# Patient Record
Sex: Male | Born: 1970
Health system: Southern US, Community
[De-identification: ages and names within clinical notes are randomized; demographics above are authoritative.]

## PROBLEM LIST (undated history)

## (undated) DIAGNOSIS — M1990 Unspecified osteoarthritis, unspecified site: Secondary | ICD-10-CM

## (undated) DIAGNOSIS — R7989 Other specified abnormal findings of blood chemistry: Secondary | ICD-10-CM

## (undated) HISTORY — DX: Other specified abnormal findings of blood chemistry: R79.89

---
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
## 2006-04-20 ENCOUNTER — Emergency Department: Payer: Self-pay | Admitting: Emergency Medicine

## 2007-12-01 ENCOUNTER — Emergency Department: Payer: Self-pay | Admitting: Emergency Medicine

## 2009-03-11 ENCOUNTER — Ambulatory Visit: Payer: Self-pay | Admitting: General Practice

## 2011-10-01 ENCOUNTER — Emergency Department: Payer: Self-pay | Admitting: General Practice

## 2014-08-29 ENCOUNTER — Telehealth: Payer: Self-pay | Admitting: Urology

## 2014-08-29 DIAGNOSIS — N5082 Scrotal pain: Secondary | ICD-10-CM

## 2014-08-29 NOTE — Telephone Encounter (Signed)
Patient stated that Dr. Erlene Quan told him that if we was not feeling better to give the office a call.  Patient called this morning and stated that he is really not feeling any better, still having pain.  He is taking his last medication today.

## 2014-08-29 NOTE — Telephone Encounter (Signed)
Spoke with patient, patient had improved somewhat but acutely worsened after doing a "dead lift" type activity for work with pain located just behind his right testicle..  His pain has been more severe again this week.    I have recommended that we pursue scrotal ultrasound with follow-up in our office thereafter to make sure there were not missing any underlying masses or hernias.  Patient is agreeable with this plan.  In the meantime, he was advised to restart ibuprofen for pain relief and to present to the emergency room if develops severe uncontrolled pain.  Hollice Espy, MD

## 2014-09-01 ENCOUNTER — Other Ambulatory Visit: Payer: Self-pay | Admitting: Family Medicine

## 2014-09-01 DIAGNOSIS — N5082 Scrotal pain: Secondary | ICD-10-CM

## 2014-09-02 NOTE — Telephone Encounter (Signed)
Patient is scheduled for an ultrasound on 09/03/14 at 3:15pm and a follow up appointment with you on 09/05/14.

## 2014-09-03 ENCOUNTER — Ambulatory Visit
Admission: RE | Admit: 2014-09-03 | Discharge: 2014-09-03 | Disposition: A | Discharge status: Home / Self Care | Payer: BLUE CROSS/BLUE SHIELD | Source: Ambulatory Visit | Attending: Urology | Admitting: Urology

## 2014-09-03 ENCOUNTER — Telehealth: Payer: Self-pay | Admitting: Urology

## 2014-09-03 DIAGNOSIS — N508 Other specified disorders of male genital organs: Secondary | ICD-10-CM | POA: Diagnosis present

## 2014-09-03 DIAGNOSIS — N5082 Scrotal pain: Secondary | ICD-10-CM

## 2014-09-03 NOTE — Telephone Encounter (Signed)
Reviewed scrotal ultrasound with patient.  No pathology identified.  He does state that his pain seems to be improving and only worsens which is working out/ being physically active.  I have advised him to take it easy and back of exercise for a while until his pain fully resolves then slowly add back activity as tolerated.    No need to f/u Friday as scheduled as he is doing better.    He will call to arrange f/u as needed.   Hollice Espy, MD

## 2014-09-05 ENCOUNTER — Ambulatory Visit: Payer: Self-pay | Admitting: Urology

## 2014-12-19 ENCOUNTER — Other Ambulatory Visit: Payer: Self-pay | Admitting: Physician Assistant

## 2014-12-19 DIAGNOSIS — M25561 Pain in right knee: Secondary | ICD-10-CM

## 2014-12-30 ENCOUNTER — Ambulatory Visit
Admission: RE | Admit: 2014-12-30 | Discharge: 2014-12-30 | Disposition: A | Discharge status: Home / Self Care | Payer: Worker's Compensation | Source: Ambulatory Visit | Attending: Physician Assistant | Admitting: Physician Assistant

## 2014-12-30 DIAGNOSIS — M25561 Pain in right knee: Secondary | ICD-10-CM | POA: Diagnosis present

## 2014-12-30 DIAGNOSIS — X58XXXD Exposure to other specified factors, subsequent encounter: Secondary | ICD-10-CM | POA: Diagnosis not present

## 2014-12-30 DIAGNOSIS — S83511D Sprain of anterior cruciate ligament of right knee, subsequent encounter: Secondary | ICD-10-CM | POA: Diagnosis not present

## 2014-12-30 DIAGNOSIS — D1800 Hemangioma unspecified site: Secondary | ICD-10-CM | POA: Diagnosis not present

## 2014-12-30 DIAGNOSIS — M9420 Chondromalacia, unspecified site: Secondary | ICD-10-CM | POA: Insufficient documentation

## 2014-12-30 DIAGNOSIS — M179 Osteoarthritis of knee, unspecified: Secondary | ICD-10-CM | POA: Diagnosis not present

## 2015-01-13 ENCOUNTER — Ambulatory Visit
Admission: RE | Admit: 2015-01-13 | Discharge: 2015-01-13 | Disposition: A | Discharge status: Home / Self Care | Payer: BLUE CROSS/BLUE SHIELD | Source: Ambulatory Visit | Attending: Orthopedic Surgery | Admitting: Orthopedic Surgery

## 2015-01-13 ENCOUNTER — Encounter
Admission: RE | Admit: 2015-01-13 | Discharge: 2015-01-13 | Disposition: A | Discharge status: Home / Self Care | Payer: Worker's Compensation | Source: Ambulatory Visit | Attending: Orthopedic Surgery | Admitting: Orthopedic Surgery

## 2015-01-13 DIAGNOSIS — S83511A Sprain of anterior cruciate ligament of right knee, initial encounter: Secondary | ICD-10-CM | POA: Diagnosis not present

## 2015-01-13 DIAGNOSIS — R002 Palpitations: Secondary | ICD-10-CM | POA: Insufficient documentation

## 2015-01-13 DIAGNOSIS — Y929 Unspecified place or not applicable: Secondary | ICD-10-CM | POA: Diagnosis not present

## 2015-01-13 DIAGNOSIS — Y939 Activity, unspecified: Secondary | ICD-10-CM | POA: Diagnosis not present

## 2015-01-13 DIAGNOSIS — X58XXXA Exposure to other specified factors, initial encounter: Secondary | ICD-10-CM | POA: Diagnosis not present

## 2015-01-13 HISTORY — DX: Unspecified osteoarthritis, unspecified site: M19.90

## 2015-01-13 LAB — BASIC METABOLIC PANEL
ANION GAP: 7 (ref 5–15)
BUN: 21 mg/dL — AB (ref 6–20)
CALCIUM: 9.5 mg/dL (ref 8.9–10.3)
CO2: 29 mmol/L (ref 22–32)
Chloride: 105 mmol/L (ref 101–111)
Creatinine, Ser: 1.08 mg/dL (ref 0.61–1.24)
GFR calc Af Amer: >60 mL/min (ref >60)
Glucose, Bld: 97 mg/dL (ref 65–99)
POTASSIUM: 3.7 mmol/L (ref 3.5–5.1)
SODIUM: 141 mmol/L (ref 135–145)

## 2015-01-13 LAB — CBC
HCT: 40.9 % (ref 40.0–52.0)
Hemoglobin: 14.2 g/dL (ref 13.0–18.0)
MCH: 28.5 pg (ref 26.0–34.0)
MCHC: 34.7 g/dL (ref 32.0–36.0)
MCV: 82.2 fL (ref 80.0–100.0)
PLATELETS: 188 10*3/uL (ref 150–440)
RBC: 4.98 MIL/uL (ref 4.40–5.90)
RDW: 12.9 % (ref 11.5–14.5)
WBC: 7 10*3/uL (ref 3.8–10.6)

## 2015-01-13 LAB — URINALYSIS COMPLETE WITH MICROSCOPIC (ARMC ONLY)
Bacteria, UA: NONE SEEN
Bilirubin Urine: NEGATIVE
Glucose, UA: NEGATIVE mg/dL
Hgb urine dipstick: NEGATIVE
KETONES UR: NEGATIVE mg/dL
Leukocytes, UA: NEGATIVE
Nitrite: NEGATIVE
PH: 8 (ref 5.0–8.0)
PROTEIN: NEGATIVE mg/dL
Specific Gravity, Urine: 1.02 (ref 1.005–1.030)

## 2015-01-13 LAB — APTT: APTT: 33 s (ref 24–36)

## 2015-01-13 LAB — PROTIME-INR
INR: 1.06
PROTHROMBIN TIME: 14 s (ref 11.4–15.0)

## 2015-01-13 NOTE — Patient Instructions (Signed)
  Your procedure is scheduled on: January 15, 2015(Thursday) Report to Day Surgery.[facility]) To find out your arrival time please call [phone] between 1PM - 3PM on January 14, 2015(Wednesday).  Remember: Instructions that are not followed completely may result in serious medical risk, up to and including death, or upon the discretion of your surgeon and anesthesiologist your surgery may need to be rescheduled.    __x__ 1. Do not eat food or drink liquids after midnight. No gum chewing or hard candies.     ____ 2. No Alcohol for 24 hours before or after surgery.   ____ 3. Bring all medications with you on the day of surgery if instructed.    __x__ 4. Notify your doctor if there is any change in your medical condition     (cold, fever, infections).     Do not wear jewelry, make-up, hairpins, clips or nail polish.  Do not wear lotions, powders, or perfumes. You may wear deodorant.  Do not shave 48 hours prior to surgery. Men may shave face and neck.  Do not bring valuables to the hospital.    [facility] is not responsible for any belongings or valuables.               Contacts, dentures or bridgework may not be worn into surgery.  Leave your suitcase in the car. After surgery it may be brought to your room.  For patients admitted to the hospital, discharge time is determined by your                treatment team.   Patients discharged the day of surgery will not be allowed to drive home.   Please read over the following fact sheets that you were given:   Surgical Site Infection Prevention   ____ Take these medicines the morning of surgery with A SIP OF WATER:    1.       ____ Fleet Enema (as directed)   __x__ Use CHG Soap as directed  ____ Use inhalers on the day of surgery  ____ Stop metformin 2 days prior to surgery    ____ Take 1/2 of usual insulin dose the night before surgery and none on the morning of surgery.   ____ Stop Coumadin/Plavix/aspirin on    __x__ Stop Anti-inflammatories on (Tylenol ok to take for pain if needed)   ____ Stop supplements until after surgery.    ____ Bring C-Pap to the hospital.

## 2015-01-14 ENCOUNTER — Ambulatory Visit: Payer: BLUE CROSS/BLUE SHIELD | Admitting: Physical Therapy

## 2015-01-15 ENCOUNTER — Ambulatory Visit: Payer: Worker's Compensation | Admitting: Certified Registered Nurse Anesthetist

## 2015-01-15 ENCOUNTER — Ambulatory Visit
Admission: RE | Admit: 2015-01-15 | Discharge: 2015-01-15 | Disposition: A | Discharge status: Home / Self Care | Payer: Worker's Compensation | Source: Ambulatory Visit | Attending: Orthopedic Surgery | Admitting: Orthopedic Surgery

## 2015-01-15 ENCOUNTER — Encounter: Payer: Self-pay | Admitting: *Deleted

## 2015-01-15 ENCOUNTER — Encounter
Admission: RE | Disposition: A | Discharge status: Home / Self Care | Payer: Self-pay | Source: Ambulatory Visit | Attending: Orthopedic Surgery

## 2015-01-15 DIAGNOSIS — Y929 Unspecified place or not applicable: Secondary | ICD-10-CM | POA: Insufficient documentation

## 2015-01-15 DIAGNOSIS — S83511A Sprain of anterior cruciate ligament of right knee, initial encounter: Secondary | ICD-10-CM | POA: Insufficient documentation

## 2015-01-15 DIAGNOSIS — Y939 Activity, unspecified: Secondary | ICD-10-CM | POA: Insufficient documentation

## 2015-01-15 DIAGNOSIS — X58XXXA Exposure to other specified factors, initial encounter: Secondary | ICD-10-CM | POA: Insufficient documentation

## 2015-01-15 HISTORY — PX: KNEE ARTHROSCOPY WITH ANTERIOR CRUCIATE LIGAMENT (ACL) REPAIR WITH HAMSTRING GRAFT: SHX5645

## 2015-01-15 SURGERY — KNEE ARTHROSCOPY WITH ANTERIOR CRUCIATE LIGAMENT (ACL) REPAIR WITH HAMSTRING GRAFT
Anesthesia: General | Site: Knee | Laterality: Right | Wound class: Clean

## 2015-01-15 MED ORDER — EPINEPHRINE HCL 1 MG/ML IJ SOLN
INTRAMUSCULAR | Status: DC | PRN
  Administered 2015-01-15: 1 mg via INTRAMUSCULAR

## 2015-01-15 MED ORDER — ACETAMINOPHEN 325 MG PO TABS: 650.0000 mg | ORAL_TABLET | Freq: Four times a day (QID) | ORAL | Status: DC | PRN

## 2015-01-15 MED ORDER — FENTANYL CITRATE (PF) 100 MCG/2ML IJ SOLN
INTRAMUSCULAR | Status: DC
Start: 2015-01-15 — End: 2015-01-15
  Filled 2015-01-15: qty 2

## 2015-01-15 MED ORDER — HYDROMORPHONE HCL 1 MG/ML IJ SOLN
0.2500 mg | INTRAMUSCULAR | Status: DC | PRN
  Administered 2015-01-15: 0.5 mg via INTRAVENOUS
  Administered 2015-01-15 (×2): 0.25 mg via INTRAVENOUS
  Administered 2015-01-15 (×2): 0.5 mg via INTRAVENOUS

## 2015-01-15 MED ORDER — PROMETHAZINE HCL 25 MG/ML IJ SOLN
6.2500 mg | INTRAMUSCULAR | Status: DC | PRN
  Administered 2015-01-15: 6.25 mg via INTRAVENOUS

## 2015-01-15 MED ORDER — BUPIVACAINE HCL 0.25 % IJ SOLN
INTRAMUSCULAR | Status: DC | PRN
  Administered 2015-01-15: 30 mL

## 2015-01-15 MED ORDER — OXYCODONE HCL 5 MG PO TABS
ORAL_TABLET | ORAL | Status: AC
  Administered 2015-01-15: 5 mg via ORAL
  Filled 2015-01-15: qty 1

## 2015-01-15 MED ORDER — MORPHINE SULFATE (PF) 2 MG/ML IV SOLN: 2.0000 mg | INTRAVENOUS | Status: DC | PRN

## 2015-01-15 MED ORDER — NEOMYCIN-POLYMYXIN B GU 40-200000 IR SOLN
Status: AC
  Filled 2015-01-15: qty 4

## 2015-01-15 MED ORDER — NEOMYCIN-POLYMYXIN B GU 40-200000 IR SOLN
Status: DC | PRN
  Administered 2015-01-15: 4 mL

## 2015-01-15 MED ORDER — ONDANSETRON HCL 4 MG/2ML IJ SOLN: 4.0000 mg | Freq: Four times a day (QID) | INTRAMUSCULAR | Status: DC | PRN

## 2015-01-15 MED ORDER — FENTANYL CITRATE (PF) 100 MCG/2ML IJ SOLN
25.0000 ug | INTRAMUSCULAR | Status: DC | PRN
  Administered 2015-01-15: 25 ug via INTRAVENOUS
  Administered 2015-01-15: 50 ug via INTRAVENOUS
  Administered 2015-01-15: 25 ug via INTRAVENOUS

## 2015-01-15 MED ORDER — LIDOCAINE HCL (CARDIAC) 20 MG/ML IV SOLN
INTRAVENOUS | Status: DC | PRN
  Administered 2015-01-15: 20 mg via INTRAVENOUS

## 2015-01-15 MED ORDER — ACETAMINOPHEN 10 MG/ML IV SOLN
INTRAVENOUS | Status: DC | PRN
  Administered 2015-01-15: 1000 mg via INTRAVENOUS

## 2015-01-15 MED ORDER — FAMOTIDINE 20 MG PO TABS
ORAL_TABLET | ORAL | Status: AC
  Filled 2015-01-15: qty 1

## 2015-01-15 MED ORDER — ASPIRIN EC 325 MG PO TBEC: 325.0000 mg | DELAYED_RELEASE_TABLET | Freq: Two times a day (BID) | ORAL | Status: DC

## 2015-01-15 MED ORDER — LACTATED RINGERS IV SOLN
INTRAVENOUS | Status: DC
  Administered 2015-01-15 (×3): via INTRAVENOUS

## 2015-01-15 MED ORDER — FENTANYL CITRATE (PF) 100 MCG/2ML IJ SOLN
INTRAMUSCULAR | Status: DC | PRN
  Administered 2015-01-15: 100 ug via INTRAVENOUS
  Administered 2015-01-15: 25 ug via INTRAVENOUS

## 2015-01-15 MED ORDER — OXYCODONE HCL 5 MG PO TABS: 5.0000 mg | ORAL_TABLET | ORAL | Status: DC | PRN

## 2015-01-15 MED ORDER — SODIUM CHLORIDE 0.9 % IV SOLN: INTRAVENOUS | Status: DC

## 2015-01-15 MED ORDER — HYDROMORPHONE HCL 1 MG/ML IJ SOLN
INTRAMUSCULAR | Status: AC
  Filled 2015-01-15: qty 1

## 2015-01-15 MED ORDER — LIDOCAINE HCL (PF) 1 % IJ SOLN
INTRAMUSCULAR | Status: AC
  Filled 2015-01-15: qty 30

## 2015-01-15 MED ORDER — HYDROMORPHONE HCL 1 MG/ML IJ SOLN
INTRAMUSCULAR | Status: DC | PRN
  Administered 2015-01-15 (×2): 0.5 mg via INTRAVENOUS

## 2015-01-15 MED ORDER — ACETAMINOPHEN 10 MG/ML IV SOLN
INTRAVENOUS | Status: AC
Start: 2015-01-15 — End: 2015-01-15
  Filled 2015-01-15: qty 100

## 2015-01-15 MED ORDER — BUPIVACAINE HCL (PF) 0.25 % IJ SOLN
INTRAMUSCULAR | Status: AC
  Filled 2015-01-15: qty 30

## 2015-01-15 MED ORDER — MIDAZOLAM HCL 5 MG/5ML IJ SOLN
INTRAMUSCULAR | Status: DC | PRN
  Administered 2015-01-15: 2 mg via INTRAVENOUS

## 2015-01-15 MED ORDER — ONDANSETRON HCL 4 MG PO TABS: 4.0000 mg | ORAL_TABLET | Freq: Four times a day (QID) | ORAL | Status: DC | PRN

## 2015-01-15 MED ORDER — DOCUSATE SODIUM 100 MG PO CAPS
100.0000 mg | ORAL_CAPSULE | Freq: Two times a day (BID) | ORAL | Status: DC
  Filled 2015-01-15 (×3): qty 1

## 2015-01-15 MED ORDER — CEFAZOLIN SODIUM-DEXTROSE 2-3 GM-% IV SOLR
INTRAVENOUS | Status: AC
  Filled 2015-01-15: qty 50

## 2015-01-15 MED ORDER — ONDANSETRON HCL 4 MG/2ML IJ SOLN
INTRAMUSCULAR | Status: DC | PRN
  Administered 2015-01-15: 4 mg via INTRAVENOUS

## 2015-01-15 MED ORDER — OXYCODONE HCL 5 MG PO TABS
5.0000 mg | ORAL_TABLET | ORAL | Status: DC | PRN
  Administered 2015-01-15: 5 mg via ORAL

## 2015-01-15 MED ORDER — SODIUM CHLORIDE 0.9 % IJ SOLN
INTRAMUSCULAR | Status: AC
  Filled 2015-01-15: qty 10

## 2015-01-15 MED ORDER — DIPHENHYDRAMINE HCL 12.5 MG/5ML PO ELIX
12.5000 mg | ORAL_SOLUTION | ORAL | Status: DC | PRN
  Filled 2015-01-15: qty 10

## 2015-01-15 MED ORDER — PROMETHAZINE HCL 25 MG/ML IJ SOLN
INTRAMUSCULAR | Status: AC
  Filled 2015-01-15: qty 1

## 2015-01-15 MED ORDER — CEFAZOLIN SODIUM-DEXTROSE 2-3 GM-% IV SOLR
2.0000 g | Freq: Once | INTRAVENOUS | Status: AC
  Administered 2015-01-15: 2 g via INTRAVENOUS

## 2015-01-15 MED ORDER — PROMETHAZINE HCL 12.5 MG PO TABS: 12.5000 mg | ORAL_TABLET | ORAL | Status: DC | PRN

## 2015-01-15 MED ORDER — EPINEPHRINE HCL 1 MG/ML IJ SOLN
INTRAMUSCULAR | Status: AC
  Filled 2015-01-15: qty 1

## 2015-01-15 MED ORDER — PROPOFOL 10 MG/ML IV BOLUS
INTRAVENOUS | Status: DC | PRN
  Administered 2015-01-15: 40 mg via INTRAVENOUS
  Administered 2015-01-15: 160 mg via INTRAVENOUS
  Administered 2015-01-15: 30 mg via INTRAVENOUS
  Administered 2015-01-15: 50 mg via INTRAVENOUS

## 2015-01-15 MED ORDER — LIDOCAINE HCL 1 % IJ SOLN
INTRAMUSCULAR | Status: DC | PRN
  Administered 2015-01-15: 8 mL

## 2015-01-15 MED ORDER — FAMOTIDINE 20 MG PO TABS
20.0000 mg | ORAL_TABLET | Freq: Once | ORAL | Status: AC
  Administered 2015-01-15: 20 mg via ORAL

## 2015-01-15 MED ORDER — ACETAMINOPHEN 650 MG RE SUPP
650.0000 mg | Freq: Four times a day (QID) | RECTAL | Status: DC | PRN
  Filled 2015-01-15: qty 1

## 2015-01-15 SURGICAL SUPPLY — 85 items
ADAPTER IRRIG TUBE 2 SPIKE SOL (ADAPTER) ×6 IMPLANT
ANCHOR BUTTON TIGHTROPE ACL RT (Orthopedic Implant) ×3 IMPLANT
ANCHOR SUPER #2 ORTHOCORD (MISCELLANEOUS) ×3 IMPLANT
BASIN GRAD PLASTIC 32OZ STRL (MISCELLANEOUS) ×3 IMPLANT
BIT DRILL PIN RETRO (DRILL) ×1 IMPLANT
BLADE SURG 15 STRL LF DISP TIS (BLADE) ×2 IMPLANT
BLADE SURG 15 STRL SS (BLADE) ×4
BLADE SURG SZ11 CARB STEEL (BLADE) ×3 IMPLANT
BNDG COHESIVE 4X5 TAN STRL (GAUZE/BANDAGES/DRESSINGS) ×3 IMPLANT
BNDG COHESIVE 6X5 TAN STRL LF (GAUZE/BANDAGES/DRESSINGS) ×3 IMPLANT
BNDG ESMARK 6X12 TAN STRL LF (GAUZE/BANDAGES/DRESSINGS) ×3 IMPLANT
BUR RADIUS 3.5 (BURR) IMPLANT
BUR RADIUS 4.0X18.5 (BURR) ×3 IMPLANT
BUR RADIUS 5.5 (BURR) ×3 IMPLANT
CLEANER CAUTERY TIP 5X5 PAD (MISCELLANEOUS) IMPLANT
CLOSURE WOUND 1/2 X4 (GAUZE/BANDAGES/DRESSINGS) ×2
COOLER POLAR GLACIER W/PUMP (MISCELLANEOUS) ×3 IMPLANT
CUTTER DUAL RETRO (MISCELLANEOUS) ×3 IMPLANT
CUTTER DUAL RETRO 9MM (CUTTER) ×3 IMPLANT
DRAPE FLUOR MINI C-ARM 54X84 (DRAPES) ×3 IMPLANT
DRAPE IMP U-DRAPE 54X76 (DRAPES) ×6 IMPLANT
DRAPE INCISE IOBAN 66X45 STRL (DRAPES) ×3 IMPLANT
DRAPE SHEET LG 3/4 BI-LAMINATE (DRAPES) ×3 IMPLANT
DRAPE TABLE BACK 80X90 (DRAPES) ×3 IMPLANT
DRAPE U-SHAPE 47X51 STRL (DRAPES) ×3 IMPLANT
DRILL FLIPCUTTER II 8.0MM (INSTRUMENTS) ×2 IMPLANT
DRILL PIN RETRO (DRILL) ×3
DURAPREP 26ML APPLICATOR (WOUND CARE) ×6 IMPLANT
FLIPCUTTER II 8.0MM (INSTRUMENTS) ×6
GAUZE PETRO XEROFOAM 1X8 (MISCELLANEOUS) ×3 IMPLANT
GAUZE SPONGE 4X4 12PLY STRL (GAUZE/BANDAGES/DRESSINGS) ×3 IMPLANT
GLOVE BIOGEL PI IND STRL 9 (GLOVE) ×1 IMPLANT
GLOVE BIOGEL PI INDICATOR 9 (GLOVE) ×2
GLOVE SURG 9.0 ORTHO LTXF (GLOVE) ×6 IMPLANT
GOWN STRL REUS TWL 2XL XL LVL4 (GOWN DISPOSABLE) ×3 IMPLANT
GOWN STRL REUS W/ TWL LRG LVL3 (GOWN DISPOSABLE) ×1 IMPLANT
GOWN STRL REUS W/TWL LRG LVL3 (GOWN DISPOSABLE) ×2
GUIDEWIRE 1.1MM (WIRE) ×3 IMPLANT
HANDLE YANKAUER SUCT BULB TIP (MISCELLANEOUS) ×3 IMPLANT
IMPL SHOULDER ARTHROSCOPY L (Shoulder) ×2 IMPLANT
IV LACTATED RINGER IRRG 3000ML (IV SOLUTION) ×16
IV LR IRRIG 3000ML ARTHROMATIC (IV SOLUTION) ×8 IMPLANT
KIT RM TURNOVER STRD PROC AR (KITS) ×3 IMPLANT
LABEL OR SOLS (LABEL) IMPLANT
MAT BLUE FLOOR 46X72 FLO (MISCELLANEOUS) ×6 IMPLANT
NDL SAFETY ECLIPSE 18X1.5 (NEEDLE) ×1 IMPLANT
NEEDLE FILTER BLUNT 18X 1/2SAF (NEEDLE) ×2
NEEDLE FILTER BLUNT 18X1 1/2 (NEEDLE) ×1 IMPLANT
NEEDLE HYPO 18GX1.5 SHARP (NEEDLE) ×2
NEPTUNE MANIFOLD (MISCELLANEOUS) ×3 IMPLANT
PACK ARTHROSCOPY KNEE (MISCELLANEOUS) ×3 IMPLANT
PAD ABD DERMACEA PRESS 5X9 (GAUZE/BANDAGES/DRESSINGS) ×6 IMPLANT
PAD CLEANER CAUTERY TIP 5X5 (MISCELLANEOUS)
PAD GROUND ADULT SPLIT (MISCELLANEOUS) ×3 IMPLANT
PAD WRAPON POLAR KNEE (MISCELLANEOUS) ×1 IMPLANT
PENCIL ELECTRO HAND CTR (MISCELLANEOUS) ×3 IMPLANT
SCREW BIOCOMP 9X28 (Screw) ×6 IMPLANT
SET TUBE SUCT SHAVER OUTFL 24K (TUBING) ×3 IMPLANT
SET TUBE TIP INTRA-ARTICULAR (MISCELLANEOUS) ×3 IMPLANT
SHOULDER ARTHROSCOPY ARTHREX L (Shoulder) ×6 IMPLANT
STAPLE SPIKE LIGAMENT 11X20MM (Staple) ×3 IMPLANT
STRIP CLOSURE SKIN 1/2X4 (GAUZE/BANDAGES/DRESSINGS) ×4 IMPLANT
SUCTION FRAZIER TIP 10 FR DISP (SUCTIONS) IMPLANT
SUT 2 FIBERLOOP 20 STRT BLUE (SUTURE) ×12
SUT ETHILON 4-0 (SUTURE) ×2
SUT ETHILON 4-0 FS2 18XMFL BLK (SUTURE) ×1
SUT FIBERSNARE 2 CLSD LOOP (SUTURE) ×3 IMPLANT
SUT FIBERWIRE #2 38 T-5 BLUE (SUTURE) ×3
SUT MNCRL AB 4-0 PS2 18 (SUTURE) ×3 IMPLANT
SUT ORTHOCORD 2X36 W/O NDL (SUTURE) ×3 IMPLANT
SUT VIC AB 0 CT1 36 (SUTURE) ×3 IMPLANT
SUT VIC AB 2-0 CT2 27 (SUTURE) ×3 IMPLANT
SUT VIC AB 2-0 SH 27 (SUTURE)
SUT VIC AB 2-0 SH 27XBRD (SUTURE) IMPLANT
SUTURE 2 FIBERLOOP 20 STRT BLU (SUTURE) ×4 IMPLANT
SUTURE ETHLN 4-0 FS2 18XMF BLK (SUTURE) ×1 IMPLANT
SUTURE FIBERWR #2 38 T-5 BLUE (SUTURE) ×1 IMPLANT
SYR BULB IRRIG 60ML STRL (SYRINGE) ×3 IMPLANT
SYRINGE 10CC LL (SYRINGE) ×6 IMPLANT
TAPE UMBIL 1/8X18 RADIOPA (MISCELLANEOUS) ×3 IMPLANT
TUBING ARTHRO INFLOW-ONLY STRL (TUBING) ×3 IMPLANT
TUBING CONNECTING 10 (TUBING) ×2 IMPLANT
TUBING CONNECTING 10' (TUBING) ×1
WAND HAND CNTRL MULTIVAC 90 (MISCELLANEOUS) ×3 IMPLANT
WRAPON POLAR PAD KNEE (MISCELLANEOUS) ×3

## 2015-01-15 NOTE — Anesthesia Postprocedure Evaluation (Signed)
  Anesthesia Post-op Note  Patient: Brandon Welch  Procedure(s) Performed: Procedure(s): KNEE ARTHROSCOPY WITH ANTERIOR CRUCIATE LIGAMENT (ACL) REPAIR WITH HAMSTRING AUTOGRAFT (Right)  Anesthesia type:General  Patient location: PACU  Post pain: Pain level controlled  Post assessment: Post-op Vital signs reviewed, Patient's Cardiovascular Status Stable, Respiratory Function Stable, Patent Airway and No signs of Nausea or vomiting  Post vital signs: Reviewed and stable  Last Vitals:  Filed Vitals:   01/15/15 0550  BP: 129/82  Pulse: 85  Temp: 36.3 C  Resp: 16    Level of consciousness: awake, alert  and patient cooperative  Complications: No apparent anesthesia complications

## 2015-01-15 NOTE — Progress Notes (Signed)
Leg brace brought in by Dr Mack Guise, and sent on bed with patient to OR

## 2015-01-15 NOTE — H&P (Signed)
The patient has been re-examined, and the chart reviewed, and there have been no interval changes to the documented history and physical.    The risks, benefits, and alternatives have been discussed at length, and the patient is willing to proceed.   

## 2015-01-15 NOTE — Anesthesia Preprocedure Evaluation (Addendum)
Anesthesia Evaluation  Patient identified by MRN, date of birth, ID band Patient awake    Reviewed: Allergy & Precautions, H&P , NPO status , Patient's Chart, lab work & pertinent test results, reviewed documented beta blocker date and time   History of Anesthesia Complications Negative for: history of anesthetic complications  Airway Mallampati: III  TM Distance: >3 FB Neck ROM: full    Dental no notable dental hx. (+) Teeth Intact   Pulmonary neg pulmonary ROS,    Pulmonary exam normal breath sounds clear to auscultation       Cardiovascular Exercise Tolerance: Good negative cardio ROS Normal cardiovascular exam Rhythm:regular Rate:Normal     Neuro/Psych negative neurological ROS  negative psych ROS   GI/Hepatic negative GI ROS, Neg liver ROS,   Endo/Other  negative endocrine ROS  Renal/GU negative Renal ROS  negative genitourinary   Musculoskeletal   Abdominal   Peds  Hematology negative hematology ROS (+)   Anesthesia Other Findings Past Medical History:   Arthritis                                                    Reproductive/Obstetrics negative OB ROS                             Anesthesia Physical Anesthesia Plan  ASA: I  Anesthesia Plan: General   Post-op Pain Management: GA combined w/ Regional for post-op pain   Induction:   Airway Management Planned:   Additional Equipment:   Intra-op Plan:   Post-operative Plan:   Informed Consent: I have reviewed the patients History and Physical, chart, labs and discussed the procedure including the risks, benefits and alternatives for the proposed anesthesia with the patient or authorized representative who has indicated his/her understanding and acceptance.   Dental Advisory Given  Plan Discussed with: Anesthesiologist, CRNA and Surgeon  Anesthesia Plan Comments:        Anesthesia Quick Evaluation

## 2015-01-15 NOTE — Progress Notes (Signed)
Ancef 2 gm sent to OR with patient

## 2015-01-15 NOTE — Op Note (Signed)
01/15/2015  11:20 AM  PATIENT:  Brandon Welch    PRE-OPERATIVE DIAGNOSIS:  RUPTURE OF ANTERIOR CRUCIATE LIGAMENT RIGHT KNEE  POST-OPERATIVE DIAGNOSIS:  Same  PROCEDURE:  KNEE ARTHROSCOPY WITH ANTERIOR CRUCIATE LIGAMENT (ACL) RECONSTRUCTION WITH HAMSTRING AUTOGRAFT  SURGEON:  Thornton Park, MD  ANESTHESIA:   General  PREOPERATIVE INDICATIONS:  Brandon Welch is a  44 y.o. 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 Has been diagnosed with a RUPTURE OF ANTERIOR CRUCIATE LIGAMENT RIGHT KNEE.  Given the patient's high demand on his right knee is elected to proceed with surgical reconstruction. The anterior cruciate ligament tears been confirmed both on physical exam and MRI. No associated chondral injuries or meniscal tear seen on his MRI.   The risks benefits and alternatives were discussed with the patient preoperatively including but not limited to the risks of infection, bleeding, nerve or blood vessel injury, knee stiffness/arthrofibrosis, hardware failure, re-tear of the anterior cruciate ligament graft, persistent pain or instability, osteoarthritis and the need for revision surgery.  Medical risks include but are not limited to DVT and pulmonary embolism, stroke, pneumonia, respiratory failure and death. Patient understood these risks and wished to proceed with surgical reconstruction.   OPERATIVE IMPLANTS: Arthrex anterior cruciate ligament tightrope RC, Artherex biocomposite 9 x 28 mm tibial interference screw and 11 mm spiked staple.  OPERATIVE FINDINGS: The anterior cruciate ligament was completely torn. The PCL was intact. There was no tearing of the medial or lateral menisci, there were no chondral injuries seen.   OPERATIVE PROCEDURE: The patient was brought to the operating room and placed in the supine position. General anesthesia was administered. 2 g of Ancef were given. The lower extremity was prepped and draped in usual sterile fashion. Exam under  anesthesia demonstrated the was performed which demonstrated anterior laxity on Lachman's and anterior drawer testing. Patient had positive pivot shift. There is a negative posterior drawer test. Patient had no instability to varus valgus stress testing at 0 and 30 of flexion. His knee range of motion was from 0 120. He did not have a significant effusion.. Time out was performed to verify the patient's name, date of birth, medical record number, correct site of surgery correct procedure to be performed. Was also used to verify the patient received antibiotics and all appropriate instruments, implants and radiographs studies were available in the room. Once all in attendance were in agreement case began. A tourniquet was applied to the right upper thigh but was not inflated.  Proposed arthroscopy incisions were drawn out with a surgical marker and pre-injected with 1% lidocaine plain. An 11 blade was used to establish an inferior medial and lateral portals. The medial portal was created under direct visualization using an 18-gauge spinal needle for localization. A full diagnostic examination of the knee was performed including the suprapatellar pouch, the patella femoral joint, medial lateral gutters, the medial and lateral compartments, the intercondylar notch in the posterior knee. Findings on arthroscopy included only an isolated anterior cruciate ligament tear. There is no meniscal tears or chondral injuries seen. Patient had the anterior cruciate ligament fibers are debrided with a 5.5 mm resector shaver blade. This was also used perform a notchplasty. Once the intercondylar notch and then prepped the attention was turned to harvesting the hamstring autografts.  A longitudinal incision was made over the anteromedial proximal tibia. The sartorius fascia was incised with a 15 blade and reflected to reveal the underlying gracilis and semitendinosus. These were harvested using  a tendon stripper. They're  prepared on the back table. The graft was measured to be 8 mm on the femoral side and 9 m on the tibial side. The length of the graft was 120 mm. The graft was placed on the Graftmaster table under 15 mmHg of tension and kept moist on the back table until implantation.   The attention was then turned to tunnel creation. The femoral tunnel cutting guide was then placed through the lateral portal. The arthroscope was placed in the medial portal at this point. The intercondylar distance was measured at 40 mm at. A flip cutter drill guide was advanced into the intercondylar notch. The blade was engaged and the femoral tunnel was created in a retrograde fashion to 35 mm. A fiber stick suture was placed through the femoral tunnel brought out the lateral portal and clamped for later graft passage. The attention was then turned to tibial tunnel creation. This was done with a fixed angle tibial retro-drill guide. A drill pin was inserted through the anterior tibia and advanced until it engaged the 9 mm drill bit. A tibial tunnel was then created in a retrograde fashion. The fiber stick was brought out through the tibial tunnel. The 4 stranded hamstring tibial autograft was then shuttled through the knee using the fiber stick graft. Once the button was flipped on the lateral femoral cortex FluoroScan image was taken to confirm it was laying flat against the lateral cortex of the femur. Once this was confirmed the hamstring graft was advanced into position using the white suture ends of the Arthrex tight rope RC button. The graft was bottomed out into the femoral tunnel. The knee was then cycled 25 times to remove creep. The knee was then flexed approximately 30. An Arthrex bio composite interference screw 9 x 20 mm was then advanced into position with countertraction on the tibial side of the graft and a posterior drawer force directed to the tibia. Once the interference screw was in position and 11 mm spiked staple was  placed over the distal end of the graft on the tibial side as backup fixation.  The patient had a firm endpoint without anterior laxity on Lachman's test. His range of motion remains 0-120. Final arthroscopic images of the graft were taken. He should have no graft impingement in full extension. The wounds were copiously irrigated. The deep fascia of the anterior tibial incision was closed with interrupted 0 Vicryl.  The of the tibial incision subcutaneous tissue was closed with a 2-0 Vicryl and the skin was approximated with a running 4-0 Monocryl. The arthroscopy portal incisions were closed with 4-0 nylon along with the small stab incision over the lateral femur used for placement of the femoral tunnel.  Patient had a dry sterile dressing applied along with Steri-Strips and Xeroform. The incisions and the joint were injected with 4% Marcaine plain.  Patient had a Polar Care sleeve, TENS unit leads and a hinged knee brace locked in extension placed. He is brought to the PACU in stable condition. I scrubbed and present the entire case and all sharp and instrument counts were correct at conclusion the case. I spoke with the patient's wife in the postop consultation room to let her know that patient was stable in recovery room the case was performed without complication.     Timoteo Gaul, MD

## 2015-01-15 NOTE — Discharge Instructions (Signed)

## 2015-01-15 NOTE — Anesthesia Procedure Notes (Signed)
Procedure Name: LMA Insertion Date/Time: 01/15/2015 7:45 AM Performed by: Naomie Dean Pre-anesthesia Checklist: Patient identified, Emergency Drugs available, Suction available, Patient being monitored and Timeout performed Patient Re-evaluated:Patient Re-evaluated prior to inductionOxygen Delivery Method: Circle system utilized Preoxygenation: Pre-oxygenation with 100% oxygen Intubation Type: IV induction Ventilation: Mask ventilation without difficulty LMA: LMA inserted LMA Size: 3.5

## 2015-01-15 NOTE — Transfer of Care (Signed)
Immediate Anesthesia Transfer of Care Note  Patient: Brandon Welch  Procedure(s) Performed: Procedure(s): KNEE ARTHROSCOPY WITH ANTERIOR CRUCIATE LIGAMENT (ACL) REPAIR WITH HAMSTRING AUTOGRAFT (Right)  Patient Location: PACU  Anesthesia Type:General  Level of Consciousness: awake  Airway & Oxygen Therapy: Patient connected to face mask oxygen  Post-op Assessment: Report given to RN  Post vital signs: stable  Last Vitals:  Filed Vitals:   01/15/15 0550  BP: 129/82  Pulse: 85  Temp: 36.3 C  Resp: 16    Complications: No apparent anesthesia complications

## 2015-05-28 ENCOUNTER — Other Ambulatory Visit: Payer: Self-pay | Admitting: Orthopedic Surgery

## 2015-05-28 DIAGNOSIS — M25561 Pain in right knee: Secondary | ICD-10-CM

## 2015-06-02 ENCOUNTER — Ambulatory Visit (HOSPITAL_COMMUNITY)
Admission: RE | Admit: 2015-06-02 | Discharge: 2015-06-02 | Disposition: A | Discharge status: Home / Self Care | Payer: Worker's Compensation | Source: Ambulatory Visit | Attending: Orthopedic Surgery | Admitting: Orthopedic Surgery

## 2015-06-02 DIAGNOSIS — M25461 Effusion, right knee: Secondary | ICD-10-CM | POA: Insufficient documentation

## 2015-06-02 DIAGNOSIS — X58XXXA Exposure to other specified factors, initial encounter: Secondary | ICD-10-CM | POA: Diagnosis not present

## 2015-06-02 DIAGNOSIS — M84361A Stress fracture, right tibia, initial encounter for fracture: Secondary | ICD-10-CM | POA: Diagnosis not present

## 2015-06-02 DIAGNOSIS — R938 Abnormal findings on diagnostic imaging of other specified body structures: Secondary | ICD-10-CM | POA: Diagnosis not present

## 2015-06-02 DIAGNOSIS — M25561 Pain in right knee: Secondary | ICD-10-CM | POA: Diagnosis not present

## 2015-06-15 ENCOUNTER — Ambulatory Visit: Payer: BLUE CROSS/BLUE SHIELD

## 2016-08-23 IMAGING — CR DG ORBITS FOR FOREIGN BODY
1 series · 2 of 2 positions shown · non-contrast
Comparison: None.

CLINICAL DATA: Metal working/exposure; clearance prior to MRI

EXAM:
ORBITS FOR FOREIGN BODY - 2 VIEW

[Series 1: w orbit pa · 0.14mm/px · 2 of 2 slices shown]
[im 1/2]
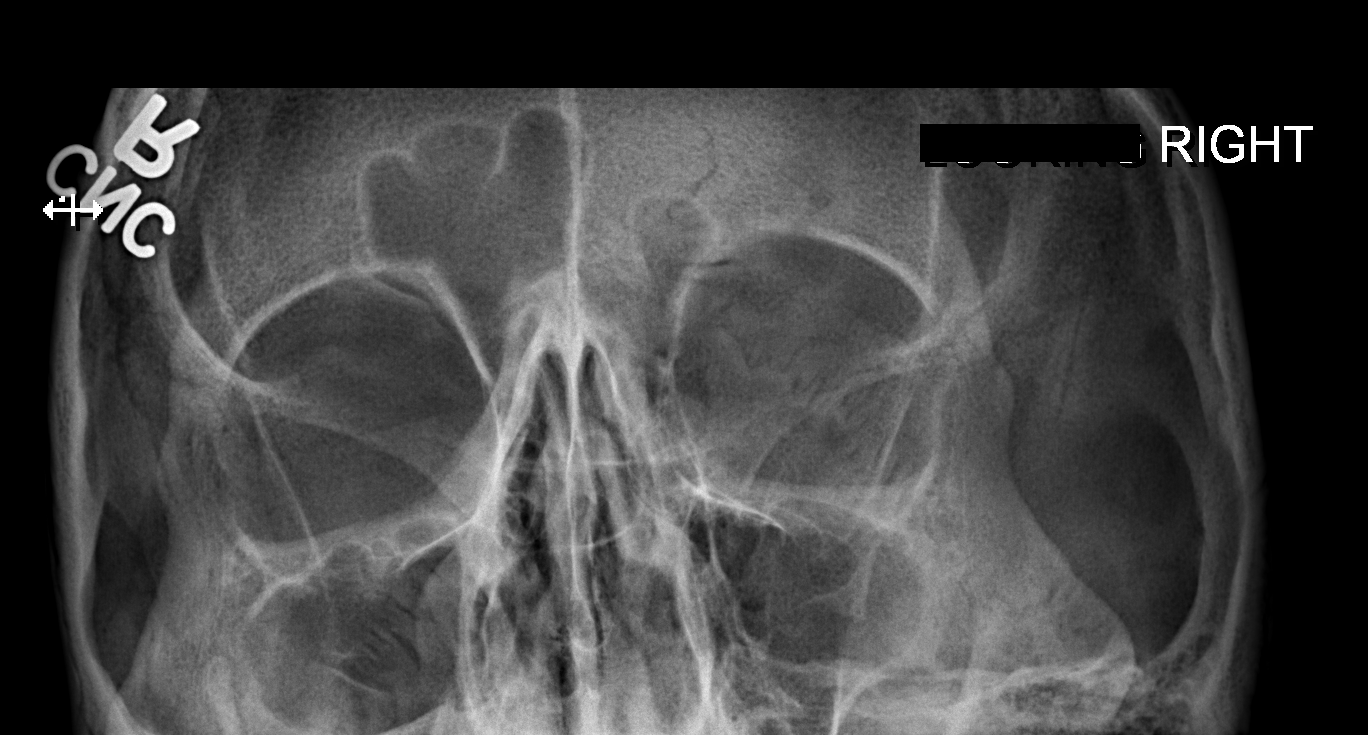
[im 2/2]
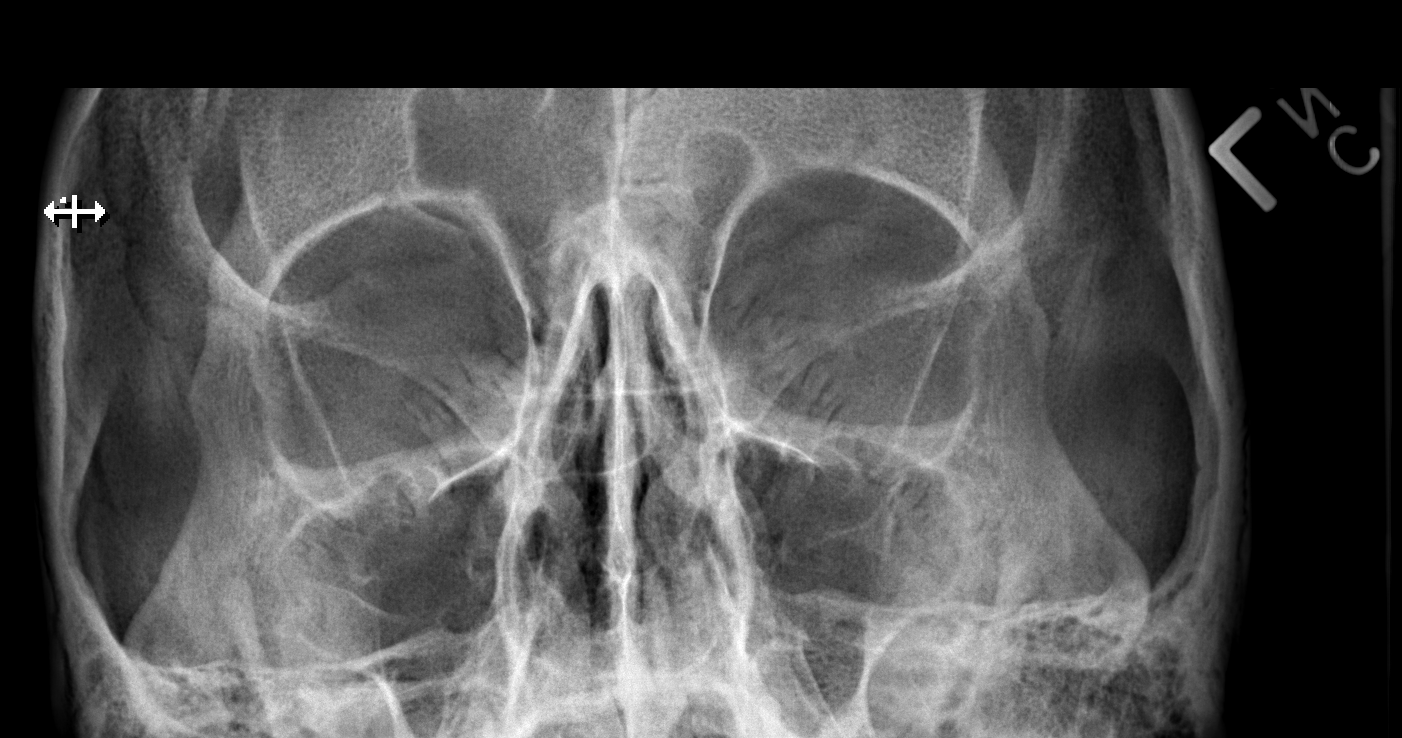

[2 of 2 positions shown; findings below may reference images not displayed]

FINDINGS: There is no evidence of metallic foreign body within the orbits. No
significant bone abnormality identified.
IMPRESSION: No evidence of metallic foreign body within the orbits.

## 2016-08-23 IMAGING — MR MR KNEE*R* W/O CM
6 series · 39 of 40 positions shown · non-contrast
Comparison: None.

CLINICAL DATA: Twisting injury at work 6 weeks ago. Persistent pain
and swelling.

EXAM:
MRI OF THE RIGHT KNEE WITHOUT CONTRAST
TECHNIQUE: Multiplanar, multisequence MR imaging of the knee was performed. No
intravenous contrast was administered.

[Series 3: PD fat-sat · axial · 3.0mm · 0.29mm/px · z∈[-53,+53]mm · 7 of 33 slices shown (1 of 3)]
[im 1/33]
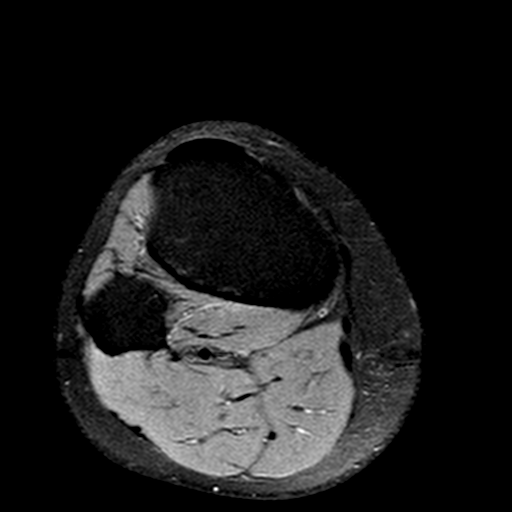
[im 6/33]
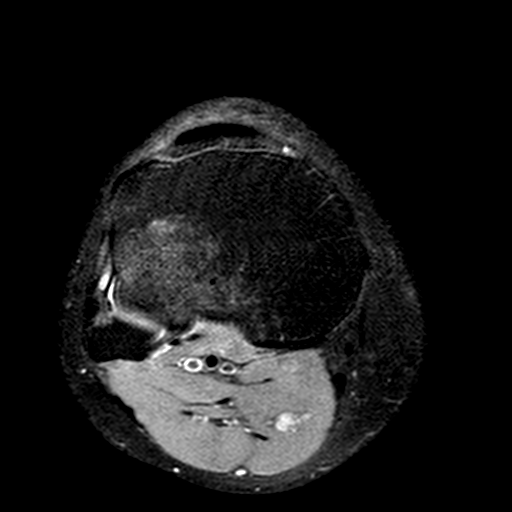
[im 11/33]
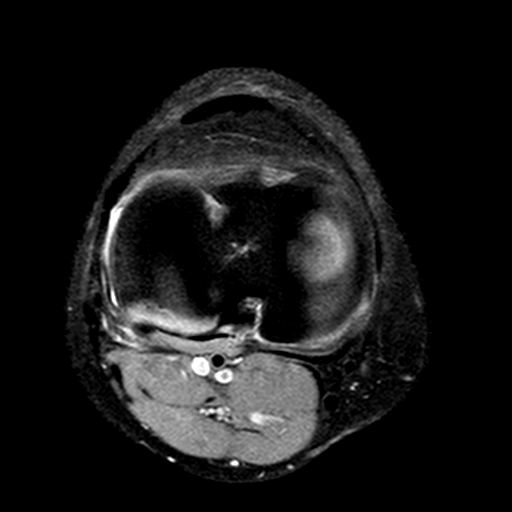
[im 17/33]
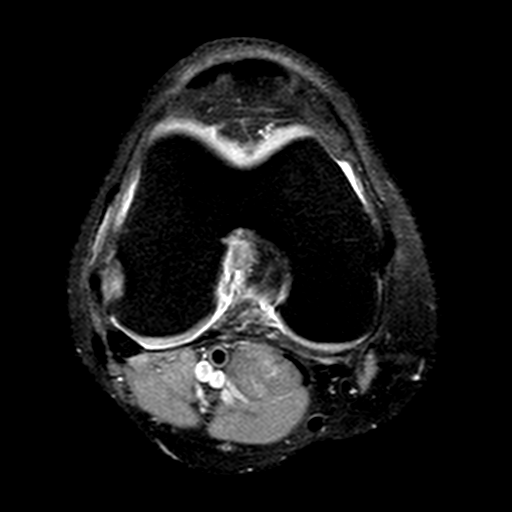
[im 22/33]
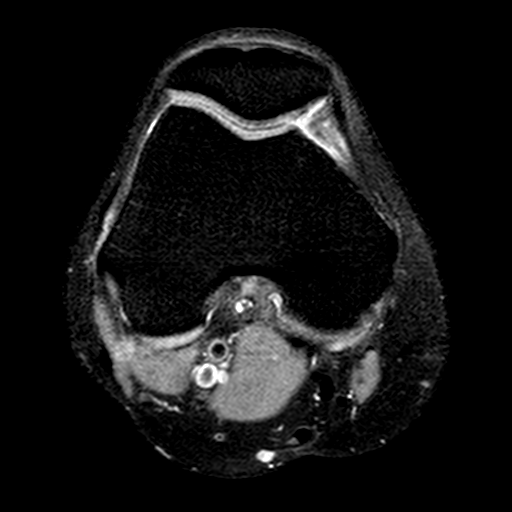
[im 27/33]
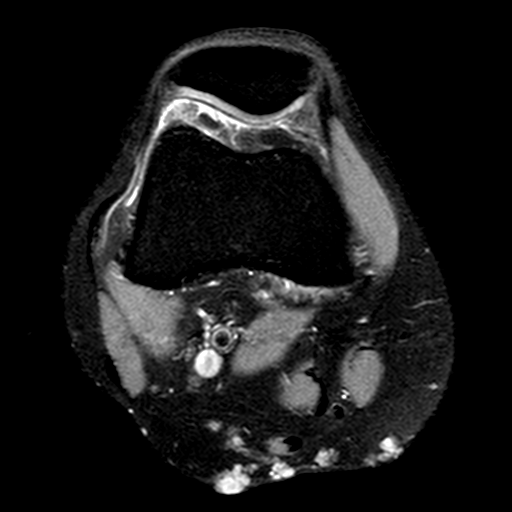
[im 33/33]
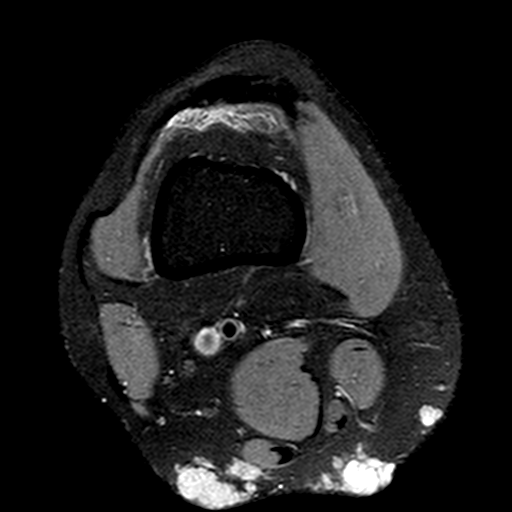

[Series 4: T2 fat-sat · coronal · 3.0mm · 0.62mm/px · 7 of 33 slices shown]
[im 1/33]
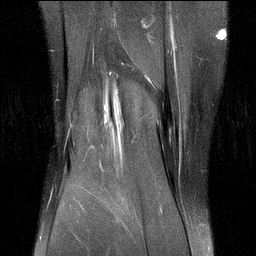
[im 6/33]
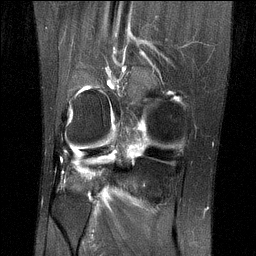
[im 11/33]
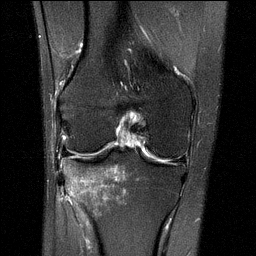
[im 17/33]
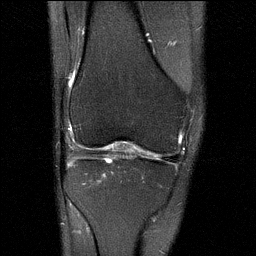
[im 22/33]
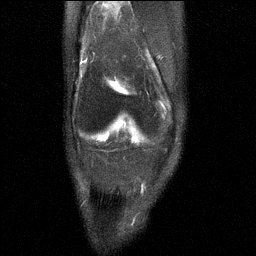
[im 27/33]
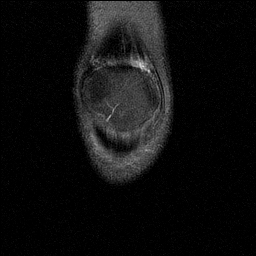
[im 33/33]
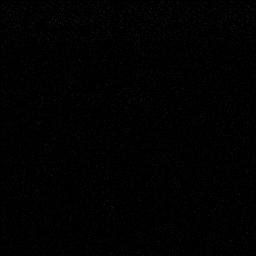

[Series 5: PD fat-sat · sagittal · 3.0mm · 0.62mm/px · 7 of 32 slices shown (2 of 3)]
[im 1/32]
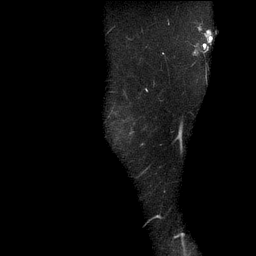
[im 6/32]
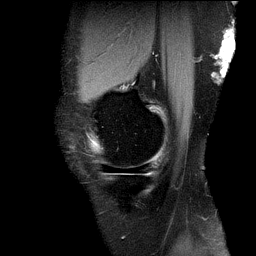
[im 11/32]
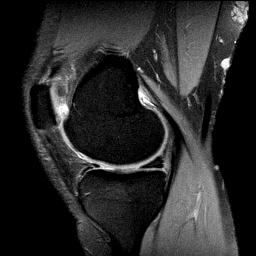
[im 16/32]
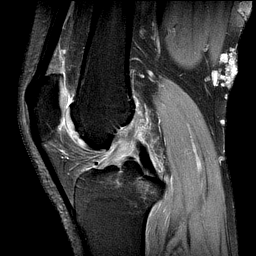
[im 21/32]
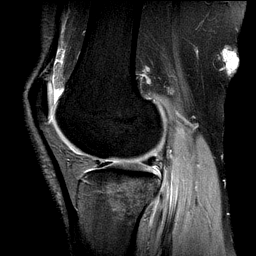
[im 26/32]
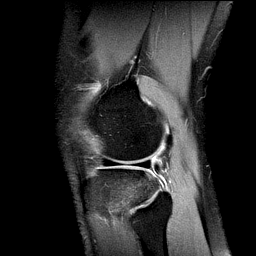
[im 32/32]
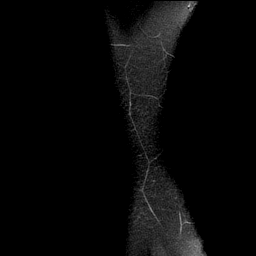

[Series 6: PD fat-sat · coronal · 3.0mm · 0.62mm/px · 8 of 33 slices shown (3 of 3)]
[im 1/33]
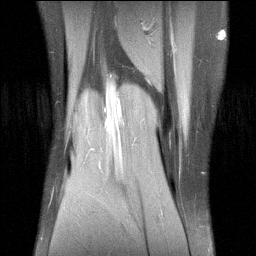
[im 5/33]
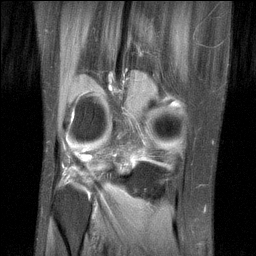
[im 10/33]
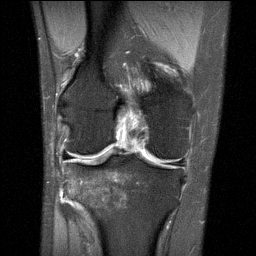
[im 14/33]
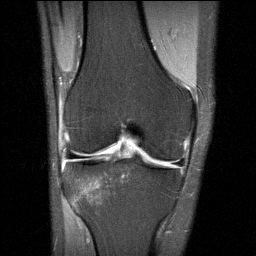
[im 19/33]
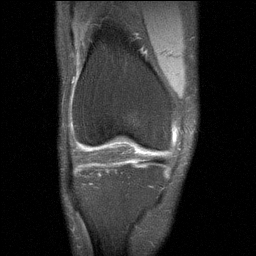
[im 23/33]
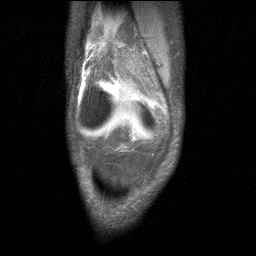
[im 28/33]
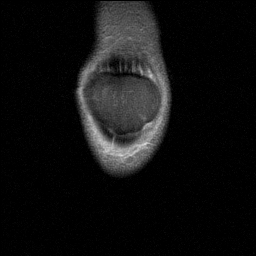
[im 33/33]
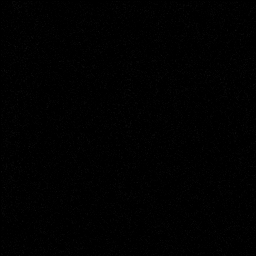

[Series 7: T1 · coronal · 3.0mm · 0.62mm/px · 7 of 33 slices shown]
[im 1/33]
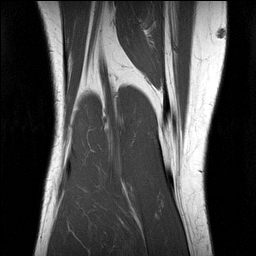
[im 5/33]
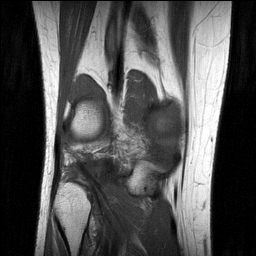
[im 10/33]
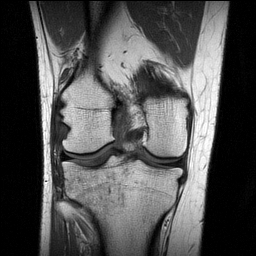
[im 14/33]
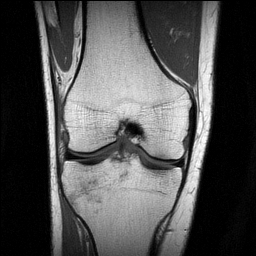
[im 19/33]
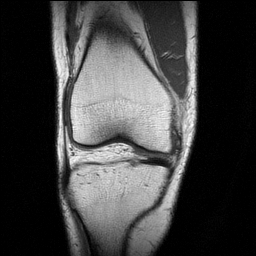
[im 23/33]
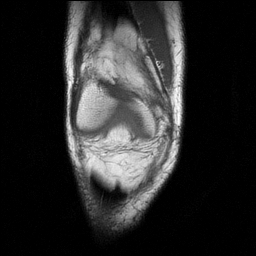
[im 28/33]
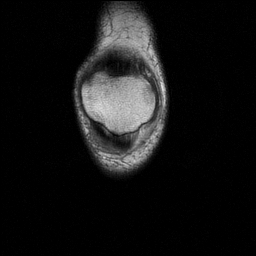

[Series 8: PD · coronal · 3.0mm · 0.31mm/px · 3 of 12 slices shown]
[im 1/12]
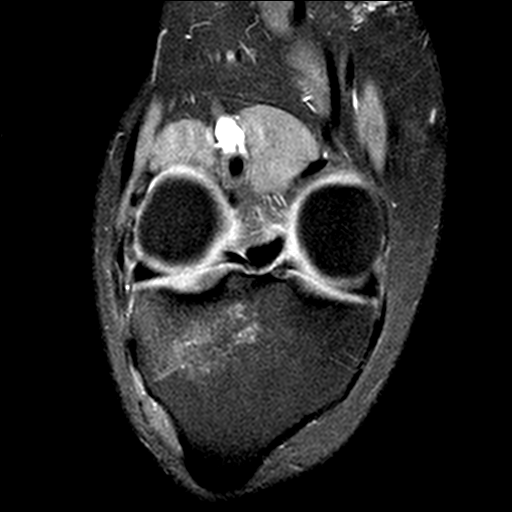
[im 6/12]
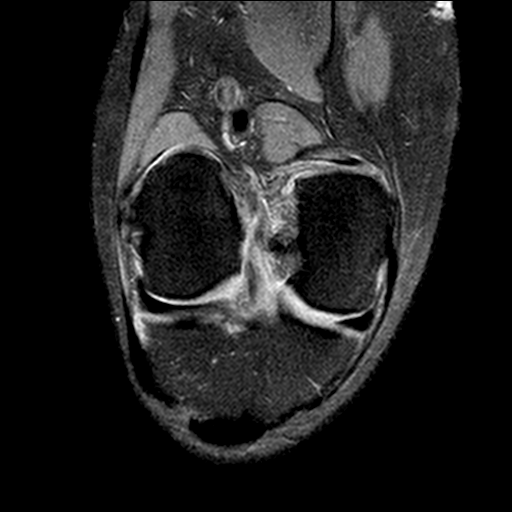
[im 12/12]
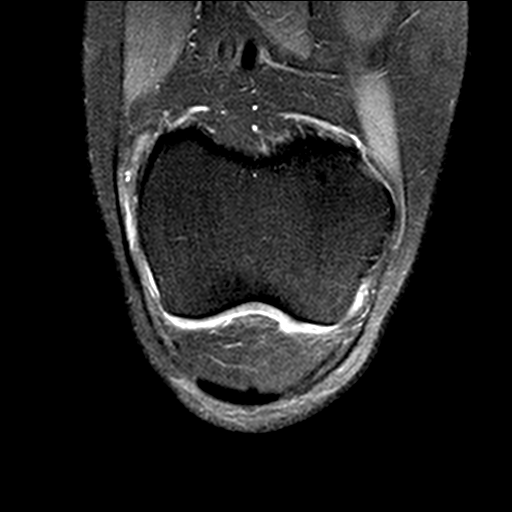

[39 of 40 positions shown; findings below may reference images not displayed]

FINDINGS: MENISCI

Medial meniscus:  Intact.

Lateral meniscus:  Intact.

LIGAMENTS

Cruciates: Midsubstance ACL tear. There may be a few wispy fibers
intact but the ligament is likely dysfunctional/nonfunctional. The
PCL is intact.

Collaterals:  Intact.

CARTILAGE

Patellofemoral:  Mild degenerative chondrosis/ chondromalacia.

Medial:  Mild degenerative chondrosis.

Lateral:  Mild degenerative chondrosis.

Joint:  Small joint effusion and mild synovitis.

Popliteal Fossa:  No popliteal mass or Baker's cyst.

Extensor Mechanism: The patella retinacular structures are intact
and the quadriceps and patellar tendons are intact.

Bones: Posterior tibial bone contusion laterally with small
subchondral impaction type fracture and a small defect in the
subchondral plate.

Other: There is a subcutaneous vascular appearing soft tissue lesion
involving the posterior aspect of the knee. This is most likely a
hemangioma.
IMPRESSION: 1. Midsubstance ACL tear. The PCL and collateral ligaments are
intact
2. Significant bone contusion involving the posterior aspect of the
lateral tibial plateau with a small impaction type fracture.
3. Mild tricompartmental degenerative chondrosis/chondromalacia.
4. Small joint effusion and mild synovitis.
5. Cutaneous and subcutaneous lesion involving the posterior aspect
of the knee, most likely a benign hemangioma. Recommend correlation
with clinical findings.

## 2016-09-06 IMAGING — CR DG CHEST 2V
1 series · 2 of 2 positions shown · non-contrast
Comparison: None.

CLINICAL DATA: Preop for knee surgery.

EXAM:
CHEST  2 VIEW

[Series 1: dg chest 2 view · 0.14mm/px · 2 of 2 slices shown]
[im 1/2]
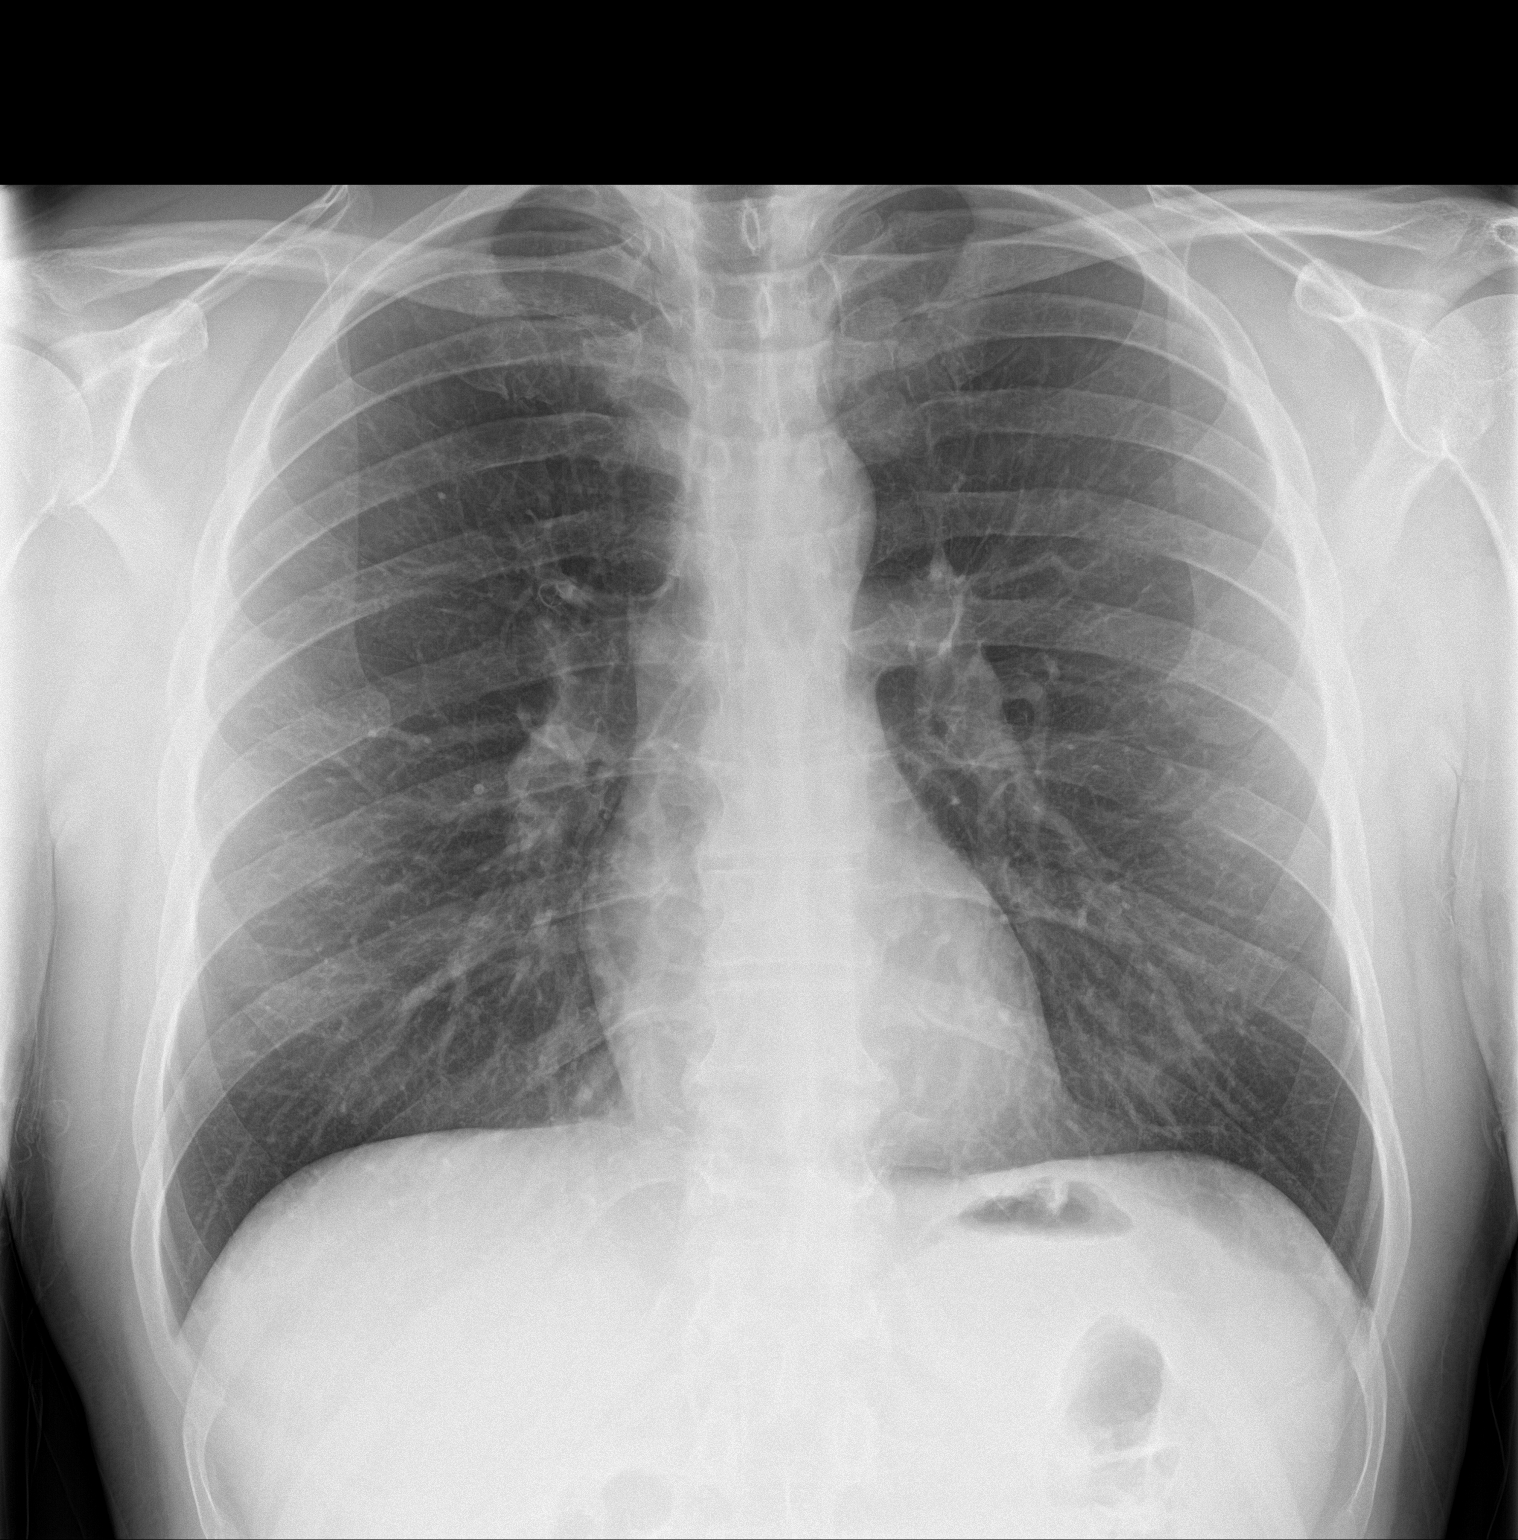
[im 2/2]
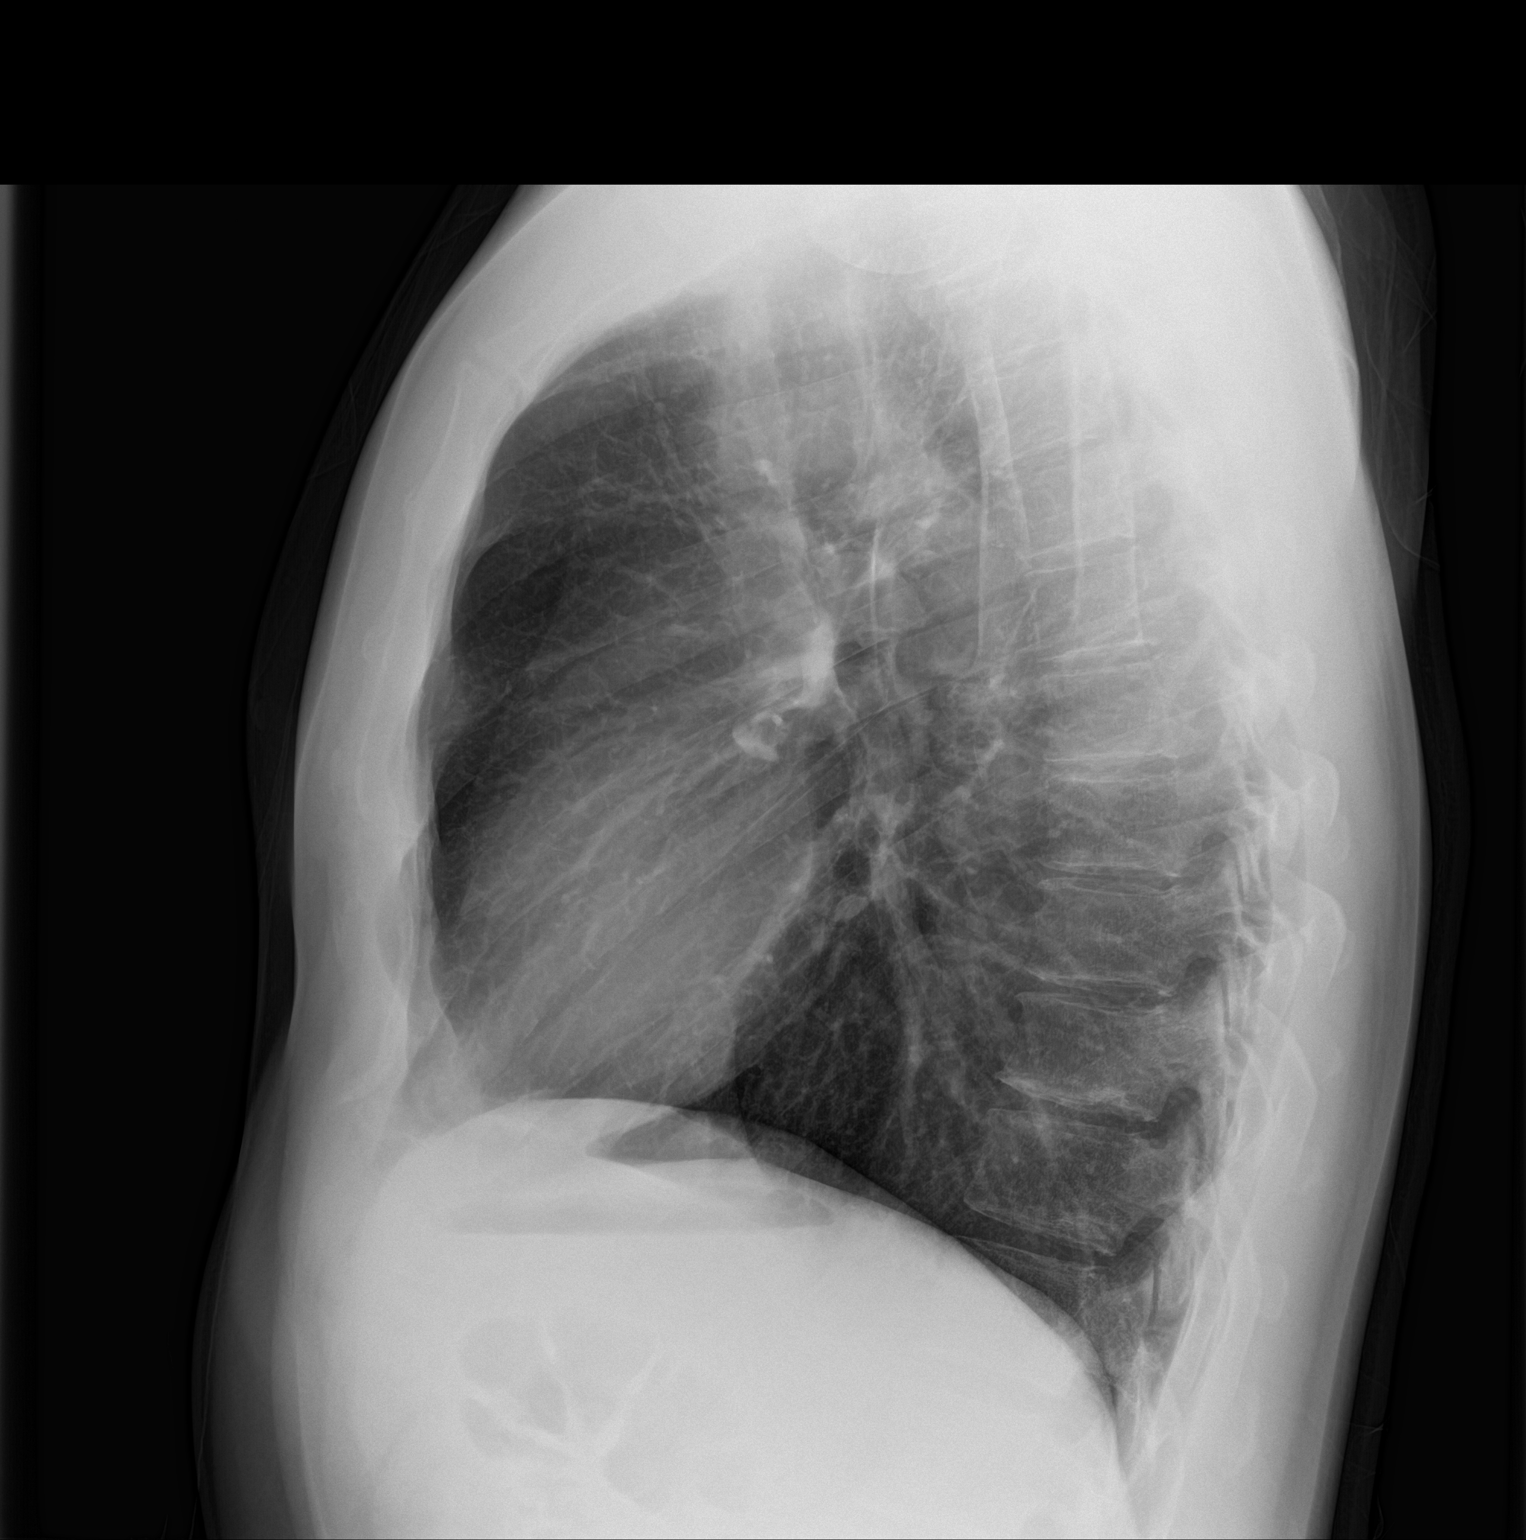

[2 of 2 positions shown; findings below may reference images not displayed]

FINDINGS: The heart size and mediastinal contours are within normal limits.
Both lungs are clear. The visualized skeletal structures are
unremarkable.
IMPRESSION: No active cardiopulmonary disease.

## 2018-05-20 ENCOUNTER — Other Ambulatory Visit: Payer: Self-pay | Admitting: Family Medicine

## 2018-05-21 NOTE — Telephone Encounter (Signed)
Pharmacy requesting refills. Thanks!  

## 2018-11-28 ENCOUNTER — Telehealth: Payer: Self-pay | Admitting: Internal Medicine

## 2018-11-28 NOTE — Telephone Encounter (Signed)
He had a sinus headache two days ago. He went to sleep and the next morning the pressure and headache went away. He now is having trouble hearing out of his right ear.

## 2018-11-28 NOTE — Telephone Encounter (Signed)
Can make an appt to evaluate this afternoon or tomorrow. Wear N95 when patient here.

## 2018-11-28 NOTE — Telephone Encounter (Signed)
Appointment scheduled for 11/29/2018 at 8:45am.  AMD

## 2018-11-28 NOTE — Telephone Encounter (Signed)
Spoke with [employer].  States he's taking Mucinex Sinus 2x/day - says you can take up to 4x/day.  Said Right ear can't hear good - kind of muffled - started yesterday.  Still going on today.  Says it's about the same today as it was yesterday.  Denies pain and drainage from the ear.  No issues with the left ear.  Tried opening mouth wide & swallowing to open the right ear up.  Tried pinching nose & blowing to open the right ear.  Neither of these helped.  AMD

## 2018-11-29 ENCOUNTER — Encounter: Payer: Self-pay | Admitting: Internal Medicine

## 2018-11-29 ENCOUNTER — Other Ambulatory Visit: Payer: Self-pay

## 2018-11-29 ENCOUNTER — Ambulatory Visit: Payer: Self-pay | Admitting: Internal Medicine

## 2018-11-29 VITALS — BP 126/76 | HR 81 | Temp 97.6°F | Resp 16 | Ht 74.0 in | Wt 214.0 lb

## 2018-11-29 DIAGNOSIS — H6981 Other specified disorders of Eustachian tube, right ear: Secondary | ICD-10-CM | POA: Insufficient documentation

## 2018-11-29 DIAGNOSIS — H6121 Impacted cerumen, right ear: Secondary | ICD-10-CM | POA: Insufficient documentation

## 2018-11-29 DIAGNOSIS — Z9889 Other specified postprocedural states: Secondary | ICD-10-CM | POA: Insufficient documentation

## 2018-11-29 DIAGNOSIS — J069 Acute upper respiratory infection, unspecified: Secondary | ICD-10-CM | POA: Insufficient documentation

## 2018-11-29 NOTE — Progress Notes (Signed)
S - Patient presents with right ear blockage feeling after had sinus congestion and pain/HA's more on that right side and some achiness and infectious concerns, he couldn't hear much at all out of the right ear vs the left, and that has improved some in last 24 hours but still a noticeable difference. No ear pain.   Denies increase in sinus congestion, no sore throat, no cough no fevers, no sx's of concern for Covid presently otherwise Hearing decreased from the right ear  He noted he may be getting some more allergy sx's in the past year or two with change of seasons, and has never taken any meds for this.   Also feels a slight protruson under his scar on his right knee . He had ACL surgery prior with a screw and a staple used to anchor he thinks, and wonders if this the issue. Not painful presently. No knee pain sx's.  Allergies  Allergen Reactions  . Aloe Rash    Current Outpatient Medications on File Prior to Visit  Medication Sig Dispense Refill  . Testosterone 20.25 MG/ACT (1.62%) GEL APP 4 PUMPS QAM UTD     No current facility-administered medications on file prior to visit.      O - NAD, masked  BP 126/76 (BP Location: Left Arm, Patient Position: Sitting, Cuff Size: Large)   Pulse 81   Temp 97.6 F (36.4 C) (Oral)   Resp 16   Ht 6\' 2"  (1.88 m)   Wt 214 lb (97.1 kg) Comment: with uniform on  SpO2 99%   BMI 27.48 kg/m    HEENT - conj - non-inj'ed, no focal sinus tenderness Left TM and canal clear,  Right TM not visualized with cerumen in canal  NT tugging on Right lobe, NT tugging on Left lobe Neck - no subauricular adenopathy, no increased anterior cervical nodes or posterior cervical nodes, no rigidity Ext -  A small very slightly raised area was palpated under the scar inferior to the right about an inch inferior to the start of the vertical scar, Not painful to palpate and no erythema, no skin disruption Affect not flat, approp with conversation  Ass/Plan 1:  cerumen impaction - right ear  Discussed the procedure of irrigation with warm water with device have in office and patient agreed to proceed and this was done without incident, he tolerated the procedure well with a little water getting on his pants and I apologized (he noted no worries, will dry) A large amount of cerumen was successfully irrigated Exam after noted min erythema in the canal (likely from procedure), TM clear.   2. URI - likely viral and better  3. Eustachian tube dysfunction - believe an element of this contributing to his hearing decrease as well as the cerumen impaction  Educated, likely secondary to recent URI and that is better Sometimes recommend a flonase nasal to help, also ibuprofen prn over tylenol type products as has anti-inflammatory effects which tylenol does not Will improve after URI likely and is usually self-limiting   4. S/p ACL repair - can have a screw or staple start to work its way out and cause a raised area that is more sx'ic. He has no sx's of concern presently, and noted if does become more bothersome or cause sx's/signs of concern as reviewed, to f/u and may need to image and refer to ortho again as sometimes they remove the screw or staple at that point.   Will f/u if hearing not improved  over time

## 2018-12-28 ENCOUNTER — Ambulatory Visit: Payer: Self-pay

## 2018-12-28 DIAGNOSIS — Z23 Encounter for immunization: Secondary | ICD-10-CM

## 2019-01-30 DIAGNOSIS — H52223 Regular astigmatism, bilateral: Secondary | ICD-10-CM | POA: Diagnosis not present

## 2019-02-26 ENCOUNTER — Other Ambulatory Visit: Payer: Self-pay

## 2019-02-26 DIAGNOSIS — E291 Testicular hypofunction: Secondary | ICD-10-CM

## 2019-02-27 MED ORDER — TESTOSTERONE 20.25 MG/ACT (1.62%) TD GEL: TRANSDERMAL | 0 refills | Status: DC

## 2019-02-27 NOTE — Telephone Encounter (Signed)
Message sent to Admin Asst Roseanne Reno) to schedule labs with [facility] & physical for 03/2019.  AMD

## 2019-02-27 NOTE — Telephone Encounter (Signed)
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  Last physical with PA Tamala Julian Jan 2020  Last clinic appt Dr Roxan Hockey 11/29/2018.  Bridge refill given until patient can be scheduled labs and follow up appt.  Electronic Rx sent to his pharmacy of choice testosterone 1.62% gel apply 4 pumps qam #150 RF0  PMP website currently down reviewed [place] prior to authorizing Rx.  Will review PMP website when available ticket into help desk and confirmed last fill date with patient pharmacy staff last filled 28 Dec 2018

## 2019-04-01 ENCOUNTER — Ambulatory Visit: Payer: Self-pay

## 2019-04-01 ENCOUNTER — Other Ambulatory Visit: Payer: Self-pay

## 2019-04-01 DIAGNOSIS — Z0000 Encounter for general adult medical examination without abnormal findings: Secondary | ICD-10-CM

## 2019-04-01 DIAGNOSIS — E291 Testicular hypofunction: Secondary | ICD-10-CM

## 2019-04-01 LAB — POCT URINALYSIS DIPSTICK
Bilirubin, UA: NEGATIVE
Blood, UA: NEGATIVE
Glucose, UA: NEGATIVE
Ketones, UA: NEGATIVE
Leukocytes, UA: NEGATIVE
Nitrite, UA: NEGATIVE
Protein, UA: NEGATIVE
Spec Grav, UA: >=1.03 — AB (ref 1.010–1.025)
Urobilinogen, UA: 0.2 E.U./dL
pH, UA: 5.5 (ref 5.0–8.0)

## 2019-04-02 LAB — CMP12+LP+TP+TSH+6AC+PSA+CBC…
ALT: 20 IU/L (ref 0–44)
Albumin/Globulin Ratio: 2 (ref 1.2–2.2)
Albumin: 4.6 g/dL (ref 4.0–5.0)
Alkaline Phosphatase: 82 IU/L (ref 39–117)
BUN/Creatinine Ratio: 13 (ref 9–20)
Basos: 1 %
Calcium: 9.4 mg/dL (ref 8.7–10.2)
Chol/HDL Ratio: 3.4 ratio (ref 0.0–5.0)
Cholesterol, Total: 171 mg/dL (ref 100–199)
EOS (ABSOLUTE): 0.1 10*3/uL (ref 0.0–0.4)
Eos: 2 %
Estimated CHD Risk: 0.5 times avg. (ref 0.0–1.0)
GFR calc non Af Amer: 74 mL/min/{1.73_m2} (ref >59)
Globulin, Total: 2.3 g/dL (ref 1.5–4.5)
HDL: 50 mg/dL (ref >39)
Hemoglobin: 13.7 g/dL (ref 13.0–17.7)
Immature Grans (Abs): 0 10*3/uL (ref 0.0–0.1)
Iron: 78 ug/dL (ref 38–169)
LDH: 186 IU/L (ref 121–224)
LDL Chol Calc (NIH): 106 mg/dL — ABNORMAL HIGH (ref 0–99)
MCH: 28 pg (ref 26.6–33.0)
MCV: 84 fL (ref 79–97)
Potassium: 4.2 mmol/L (ref 3.5–5.2)
RBC: 4.9 x10E6/uL (ref 4.14–5.80)
RDW: 12.2 % (ref 11.6–15.4)
T3 Uptake Ratio: 26 % (ref 24–39)
TSH: 1.57 u[IU]/mL (ref 0.450–4.500)
Total Protein: 6.9 g/dL (ref 6.0–8.5)
Triglycerides: 81 mg/dL (ref 0–149)
Uric Acid: 5 mg/dL (ref 3.8–8.4)
WBC: 5.6 10*3/uL (ref 3.4–10.8)

## 2019-04-02 LAB — CMP12+LP+TP+TSH+6AC+PSA+CBC?
AST: 43 IU/L — ABNORMAL HIGH (ref 0–40)
BUN: 15 mg/dL (ref 6–24)
Basophils Absolute: 0.1 10*3/uL (ref 0.0–0.2)
Bilirubin Total: 0.4 mg/dL (ref 0.0–1.2)
Chloride: 104 mmol/L (ref 96–106)
Creatinine, Ser: 1.17 mg/dL (ref 0.76–1.27)
Free Thyroxine Index: 1.4 (ref 1.2–4.9)
GFR calc Af Amer: 85 mL/min/{1.73_m2} (ref >59)
GGT: 11 IU/L (ref 0–65)
Glucose: 93 mg/dL (ref 65–99)
Hematocrit: 41.2 % (ref 37.5–51.0)
Immature Granulocytes: 0 %
Lymphocytes Absolute: 1.5 10*3/uL (ref 0.7–3.1)
Lymphs: 27 %
MCHC: 33.3 g/dL (ref 31.5–35.7)
Monocytes Absolute: 0.5 10*3/uL (ref 0.1–0.9)
Monocytes: 8 %
Neutrophils Absolute: 3.4 10*3/uL (ref 1.4–7.0)
Neutrophils: 62 %
Phosphorus: 3.5 mg/dL (ref 2.8–4.1)
Platelets: 192 10*3/uL (ref 150–450)
Prostate Specific Ag, Serum: 1.5 ng/mL (ref 0.0–4.0)
Sodium: 140 mmol/L (ref 134–144)
T4, Total: 5.5 ug/dL (ref 4.5–12.0)
VLDL Cholesterol Cal: 15 mg/dL (ref 5–40)

## 2019-04-02 LAB — TESTOSTERONE,FREE AND TOTAL
Testosterone, Free: 16.9 pg/mL (ref 6.8–21.5)
Testosterone: 412 ng/dL (ref 264–916)

## 2019-04-04 ENCOUNTER — Other Ambulatory Visit: Payer: Self-pay

## 2019-04-04 DIAGNOSIS — E291 Testicular hypofunction: Secondary | ICD-10-CM

## 2019-04-05 ENCOUNTER — Telehealth: Payer: Self-pay | Admitting: Registered Nurse

## 2019-04-05 ENCOUNTER — Encounter: Payer: Self-pay | Admitting: Registered Nurse

## 2019-04-05 DIAGNOSIS — E291 Testicular hypofunction: Secondary | ICD-10-CM

## 2019-04-05 MED ORDER — TESTOSTERONE 20.25 MG/ACT (1.62%) TD GEL: TRANSDERMAL | 0 refills | Status: DC

## 2019-04-05 NOTE — Telephone Encounter (Signed)
Patient pharmacy [facility] [address] patient requesting refill of testosterone 1.62% gel 60 pumps supply 4 pumps apply to affected area each morning.  Labs completed 04/01/2019 testosterone 412 PSA 1.5 TSH 1.57  Lipids stable; CBC WNL and stable compared to 01/13/2015 as was BMET  Last office visit 29 Nov 2018 with Dr Roxan Hockey.  Last psa per paper chart review at [facility] 0.9 Mar 29 2018.  Last physical with PA Tamala Julian Jan 2020 and appt scheduled for 04/08/2019 with provider for annual physical.  Bridge refill electronic Rx testosterone 1.62% gel apply 4 pumps qam #150 RF0 sent to his pharmacy of choice today.  Please notify patient to ask provider he sees at appt regarding prostate exam and future refills Rx.  Reviewed PMP website all 16 Rx testosterone from 4 [facility] providers in previous 2 years.  Last from me 02/27/2019.

## 2019-04-08 ENCOUNTER — Ambulatory Visit: Payer: Self-pay | Admitting: Occupational Medicine

## 2019-04-08 ENCOUNTER — Other Ambulatory Visit: Payer: Self-pay

## 2019-04-08 ENCOUNTER — Encounter: Payer: Self-pay | Admitting: Occupational Medicine

## 2019-04-08 VITALS — BP 112/70 | HR 68 | Temp 97.7°F | Resp 12 | Ht 74.0 in | Wt 217.0 lb

## 2019-04-08 DIAGNOSIS — Z0000 Encounter for general adult medical examination without abnormal findings: Secondary | ICD-10-CM

## 2019-04-08 DIAGNOSIS — G8929 Other chronic pain: Secondary | ICD-10-CM

## 2019-04-08 DIAGNOSIS — L308 Other specified dermatitis: Secondary | ICD-10-CM

## 2019-04-08 MED ORDER — CETIRIZINE HCL 10 MG PO TABS: 10.0000 mg | ORAL_TABLET | Freq: Every day | ORAL | 1 refills | Status: DC

## 2019-04-08 MED ORDER — EUCRISA 2 % EX OINT: 1.0000 "application " | TOPICAL_OINTMENT | Freq: Two times a day (BID) | CUTANEOUS | 1 refills | Status: DC

## 2019-04-08 MED ORDER — MOMETASONE FUROATE 0.1 % EX CREA: 1.0000 "application " | TOPICAL_CREAM | Freq: Every day | CUTANEOUS | 0 refills | Status: DC

## 2019-04-08 NOTE — Progress Notes (Signed)
Patient ID: Brandon Welch DOB: 06-Jun-1970 AGE: 49 y.o. MRN: [id number]   PCP: No primary care provider on file.   Chief Complaint:  Chief Complaint  Patient presents with  . Annual Exam  . Covid Screening    No Symptoms     Subjective:    HPI:  Brandon Welch is a 49 y.o. male presents for evaluation  Chief Complaint  Patient presents with  . Annual Exam  . Covid Screening    No Symptoms    49 year old male presents to [facility] for annual physical.  Patient works as a Engineer, structural.  History of eczema and long-term testosterone replacement.  Patient with recent flare-ups of eczema. Improves with rx steroid cream, triamcinolone. Will resolve within two weeks, but then returns. Present on bilateral forearms and antecubital area and posterior neck. Patient reports episodes of dry/flaky skin on bilateral eyelids. No history of asthma or seasonal allergies.  Patient on testosterone replacement. Uses topical testosterone. Recent refill. Recent blood work WNL. Patient reports decreased energy and 10-15lb weight loss within past year. Suspects may be contributed to increased stress and less frequent work out regimen (due to [employer]).  Patient also several years status post right knee ACL repair. Continued decreased ROM (cannot fully extend) and occasional pain with significant activity.  No additional complaints/concerns.  A limited review of symptoms was performed, pertinent positives and negatives as mentioned in HPI.  The following portions of the patient's history were reviewed and updated as appropriate: allergies, current medications and past medical history.  Patient Active Problem List   Diagnosis Date Noted  . Hearing loss due to cerumen impaction, right 11/29/2018  . Acute upper respiratory infection 11/29/2018  . Dysfunction of right eustachian tube 11/29/2018  . History of repair of ACL 11/29/2018  . Scrotal pain 09/01/2014     Allergies  Allergen Reactions  . Aloe Rash    Current Outpatient Medications on File Prior to Visit  Medication Sig Dispense Refill  . Testosterone 20.25 MG/ACT (1.62%) GEL 4 pumps AAA q AM 150 g 0   No current facility-administered medications on file prior to visit.       Objective:   Vitals:   04/08/19 0824  BP: 112/70  Pulse: 68  Resp: 12  Temp: 97.7 F (36.5 C)  SpO2: 98%     Wt Readings from Last 3 Encounters:  04/08/19 217 lb (98.4 kg)  11/29/18 214 lb (97.1 kg)  01/15/15 190 lb (86.2 kg)    Physical Exam:   General Appearance:  Patient sitting comfortably on examination table. Conversational. Kermit Balo self-historian. In no acute distress. Afebrile.   Head:  Normocephalic, without obvious abnormality, atraumatic  Eyes:  PERRL, conjunctiva/corneas clear, EOM's intact  Ears:  Left ear canal WNL. No erythema or edema. No open wound. No visible purulent drainage. No tenderness with palpation over left tragus or with manipulation of left auricle. No visible erythema or edema of left mastoid. No tenderness with palpation over left mastoid. Right ear canal WNL. No erythema or edema. No open wound. No visible purulent drainage. No tenderness with palpation over right tragus or with manipulation of right auricle. No visible erythema or edema of right mastoid. No tenderness with palpation over right mastoid. Left TM WNL. Good light reflex. Visible landmarks. No erythema. No injection. No bulging or retraction. No visible perforation. No serous effusion. No visible purulent effusion. No tympanostomy tube. No scar tissue. Right TM WNL. Good light  reflex. Visible landmarks. No erythema. No injection. No bulging or retraction. No visible perforation. No serous effusion. No visible purulent effusion. No tympanostomy tube. No scar tissue.  Nose: Nares normal. Septum midline. No visible polyps. No discharge. Normal mucosa. No sinus tenderness with percussion/palpation.  Throat:  Lips, mucosa, and tongue normal; teeth and gums normal. Throat reveals no erythema. No postnasal drip. No visible cobblestoning. Tonsils with no enlargement or exudate. Uvula midline with no edema or erythema.  Neck: Supple, symmetrical, trachea midline, no adenopathy  Lungs:   Clear to auscultation bilaterally, respirations unlabored. Good aeration. No rales, rhonchi, crackles or wheezing.  Heart:  Regular rate and rhythm, S1 and S2 normal, no murmur, rub, or gallop  Abdomen:   Normal to inspection. Normoactive bowel sounds. No tenderness with palpation. No guarding, rigidity or rebound tenderness. No palpable organomegaly.  Extremities: Extremities normal, atraumatic, no cyanosis or edema  Pulses: 2+ and symmetric  Skin: Skin color, texture, turgor normal, no rashes or lesions Faintly erythematous patches/plaques on volar aspect of forearms bilaterally (worse on left arm) with extension from mid forearm to antecubital area. Associated dry/flaky skin. Similar plaques on posterior aspect of neck. No evidence of excoriation. No lichenification. No tenderness with palpation. No scabbing. No streaking redness.  Lymph nodes: Cervical, supraclavicular, and axillary nodes normal  Neurologic: Normal    Assessment & Plan:    Exam findings, diagnosis etiology and medication use and indications reviewed with patient. Follow-Up and discharge instructions provided. No emergent/urgent issues found on exam.  Patient education was provided.   Patient verbalized understanding of information provided and agrees with plan of care (POC), all questions answered. The patient is advised to call or return to clinic if condition does not see an improvement in symptoms, or to seek the care of the closest emergency department if condition worsens with the below plan.   No orders of the defined types were placed in this encounter.   Results for orders placed or performed in visit on 04/01/19  Male Executive Panel   Result Value Ref Range   Glucose 93 65 - 99 mg/dL   Uric Acid 5.0 3.8 - 8.4 mg/dL   BUN 15 6 - 24 mg/dL   Creatinine, Ser 1.17 0.76 - 1.27 mg/dL   GFR calc non Af Amer 74 >59 mL/min/1.73   GFR calc Af Amer 85 >59 mL/min/1.73   BUN/Creatinine Ratio 13 9 - 20   Sodium 140 134 - 144 mmol/L   Potassium 4.2 3.5 - 5.2 mmol/L   Chloride 104 96 - 106 mmol/L   Calcium 9.4 8.7 - 10.2 mg/dL   Phosphorus 3.5 2.8 - 4.1 mg/dL   Total Protein 6.9 6.0 - 8.5 g/dL   Albumin 4.6 4.0 - 5.0 g/dL   Globulin, Total 2.3 1.5 - 4.5 g/dL   Albumin/Globulin Ratio 2.0 1.2 - 2.2   Bilirubin Total 0.4 0.0 - 1.2 mg/dL   Alkaline Phosphatase 82 39 - 117 IU/L   LDH 186 121 - 224 IU/L   AST 43 (H) 0 - 40 IU/L   ALT 20 0 - 44 IU/L   GGT 11 0 - 65 IU/L   Iron 78 38 - 169 ug/dL   Cholesterol, Total 171 100 - 199 mg/dL   Triglycerides 81 0 - 149 mg/dL   HDL 50 >39 mg/dL   VLDL Cholesterol Cal 15 5 - 40 mg/dL   LDL Chol Calc (NIH) 106 (H) 0 - 99 mg/dL   Chol/HDL Ratio 3.4 0.0 -  5.0 ratio   Estimated CHD Risk 0.5 0.0 - 1.0 times avg.   TSH 1.570 0.450 - 4.500 uIU/mL   T4, Total 5.5 4.5 - 12.0 ug/dL   T3 Uptake Ratio 26 24 - 39 %   Free Thyroxine Index 1.4 1.2 - 4.9   Prostate Specific Ag, Serum 1.5 0.0 - 4.0 ng/mL   WBC 5.6 3.4 - 10.8 x10E3/uL   RBC 4.90 4.14 - 5.80 x10E6/uL   Hemoglobin 13.7 13.0 - 17.7 g/dL   Hematocrit 41.2 37.5 - 51.0 %   MCV 84 79 - 97 fL   MCH 28.0 26.6 - 33.0 pg   MCHC 33.3 31.5 - 35.7 g/dL   RDW 12.2 11.6 - 15.4 %   Platelets 192 150 - 450 x10E3/uL   Neutrophils 62 Not Estab. %   Lymphs 27 Not Estab. %   Monocytes 8 Not Estab. %   Eos 2 Not Estab. %   Basos 1 Not Estab. %   Neutrophils Absolute 3.4 1.4 - 7.0 x10E3/uL   Lymphocytes Absolute 1.5 0.7 - 3.1 x10E3/uL   Monocytes Absolute 0.5 0.1 - 0.9 x10E3/uL   EOS (ABSOLUTE) 0.1 0.0 - 0.4 x10E3/uL   Basophils Absolute 0.1 0.0 - 0.2 x10E3/uL   Immature Granulocytes 0 Not Estab. %   Immature Grans (Abs) 0.0 0.0 - 0.1 x10E3/uL   Testosterone,Free and Total  Result Value Ref Range   Testosterone 412 264 - 916 ng/dL   Testosterone, Free 16.9 6.8 - 21.5 pg/mL  POCT Urinalysis Dipstick (CPT 81002)  Result Value Ref Range   Color, UA Yellow    Clarity, UA Clear    Glucose, UA Negative Negative   Bilirubin, UA Negative    Ketones, UA Negative    Spec Grav, UA >=1.030 (A) 1.010 - 1.025   Blood, UA Negative    pH, UA 5.5 5.0 - 8.0   Protein, UA Negative Negative   Urobilinogen, UA 0.2 0.2 or 1.0 E.U./dL   Nitrite, UA Negative    Leukocytes, UA Negative Negative   Appearance     Odor      EKG performed. Reviewed. Regular rate and rhythm. Ventricular rate of 56bpm. No ectopy. No axis deviation. No sign of ischemia. No change compared to previous EKG (03/29/2018).   1. Encounter for annual physical exam  2. Other eczema - Crisaborole (EUCRISA) 2 % OINT; Apply 1 application topically 2 (two) times daily.  Dispense: 60 g; Refill: 1 - mometasone (ELOCON) 0.1 % cream; Apply 1 application topically daily.  Dispense: 45 g; Refill: 0 - cetirizine (ZYRTEC) 10 MG tablet; Take 1 tablet (10 mg total) by mouth daily.  Dispense: 30 tablet; Refill: 1  3. Chronic pain of right knee Discussed referral to orthopedist for joint infections (steroid and/or Synvisc).   Patient will continue topical testosterone supplementation.  Employee should return to [facility] in one year for repeat annual physical. Sooner with any issues/concerns.    Darlin Priestly, MHS, PA-C Montey Hora, MHS, PA-C Advanced Practice Provider [facility]

## 2019-06-01 ENCOUNTER — Other Ambulatory Visit: Payer: Self-pay | Admitting: Registered Nurse

## 2019-06-01 DIAGNOSIS — E291 Testicular hypofunction: Secondary | ICD-10-CM

## 2019-06-03 ENCOUNTER — Encounter: Payer: Self-pay | Admitting: Registered Nurse

## 2019-06-03 DIAGNOSIS — E291 Testicular hypofunction: Secondary | ICD-10-CM | POA: Insufficient documentation

## 2019-06-03 NOTE — Telephone Encounter (Signed)
Last Rx bridge by me Jan 2021 as appt pending later in the month.  Testosterone stable.  Range in paper chart [phone] since 2013.  Last 03/2019 412  Has been on topical testosterone since 2017.  Reviewed [place] PMP website last Rx from me Jan 2021.  Patient CBC and PSA 1.5 stable previous 0.9 and 1.0 two prior years.  Electronic Rx sent to his pharmacy of choice.  Needs prostate exam with urology or another provider.  Do not schedule prostate exams with me.  Needs testosterone and PSA/CBC in Jul 2021.

## 2019-06-03 NOTE — Telephone Encounter (Signed)
[facility] pt. Thanks!

## 2019-07-12 DIAGNOSIS — M9906 Segmental and somatic dysfunction of lower extremity: Secondary | ICD-10-CM | POA: Diagnosis not present

## 2019-07-12 DIAGNOSIS — M9901 Segmental and somatic dysfunction of cervical region: Secondary | ICD-10-CM | POA: Diagnosis not present

## 2019-07-12 DIAGNOSIS — M9903 Segmental and somatic dysfunction of lumbar region: Secondary | ICD-10-CM | POA: Diagnosis not present

## 2019-07-12 DIAGNOSIS — M9905 Segmental and somatic dysfunction of pelvic region: Secondary | ICD-10-CM | POA: Diagnosis not present

## 2019-07-12 DIAGNOSIS — M546 Pain in thoracic spine: Secondary | ICD-10-CM | POA: Diagnosis not present

## 2019-07-15 DIAGNOSIS — M9906 Segmental and somatic dysfunction of lower extremity: Secondary | ICD-10-CM | POA: Diagnosis not present

## 2019-07-15 DIAGNOSIS — M9903 Segmental and somatic dysfunction of lumbar region: Secondary | ICD-10-CM | POA: Diagnosis not present

## 2019-07-15 DIAGNOSIS — M546 Pain in thoracic spine: Secondary | ICD-10-CM | POA: Diagnosis not present

## 2019-07-15 DIAGNOSIS — M9905 Segmental and somatic dysfunction of pelvic region: Secondary | ICD-10-CM | POA: Diagnosis not present

## 2019-07-15 DIAGNOSIS — M9901 Segmental and somatic dysfunction of cervical region: Secondary | ICD-10-CM | POA: Diagnosis not present

## 2019-07-17 DIAGNOSIS — M546 Pain in thoracic spine: Secondary | ICD-10-CM | POA: Diagnosis not present

## 2019-07-17 DIAGNOSIS — M9905 Segmental and somatic dysfunction of pelvic region: Secondary | ICD-10-CM | POA: Diagnosis not present

## 2019-07-17 DIAGNOSIS — M9903 Segmental and somatic dysfunction of lumbar region: Secondary | ICD-10-CM | POA: Diagnosis not present

## 2019-07-17 DIAGNOSIS — M9906 Segmental and somatic dysfunction of lower extremity: Secondary | ICD-10-CM | POA: Diagnosis not present

## 2019-07-17 DIAGNOSIS — M9901 Segmental and somatic dysfunction of cervical region: Secondary | ICD-10-CM | POA: Diagnosis not present

## 2019-07-19 DIAGNOSIS — M546 Pain in thoracic spine: Secondary | ICD-10-CM | POA: Diagnosis not present

## 2019-07-19 DIAGNOSIS — M9901 Segmental and somatic dysfunction of cervical region: Secondary | ICD-10-CM | POA: Diagnosis not present

## 2019-07-19 DIAGNOSIS — M9905 Segmental and somatic dysfunction of pelvic region: Secondary | ICD-10-CM | POA: Diagnosis not present

## 2019-07-19 DIAGNOSIS — M9903 Segmental and somatic dysfunction of lumbar region: Secondary | ICD-10-CM | POA: Diagnosis not present

## 2019-07-19 DIAGNOSIS — M545 Low back pain: Secondary | ICD-10-CM | POA: Diagnosis not present

## 2019-07-19 DIAGNOSIS — M9906 Segmental and somatic dysfunction of lower extremity: Secondary | ICD-10-CM | POA: Diagnosis not present

## 2019-07-19 DIAGNOSIS — M9902 Segmental and somatic dysfunction of thoracic region: Secondary | ICD-10-CM | POA: Diagnosis not present

## 2019-07-19 DIAGNOSIS — M542 Cervicalgia: Secondary | ICD-10-CM | POA: Diagnosis not present

## 2019-07-22 DIAGNOSIS — M9906 Segmental and somatic dysfunction of lower extremity: Secondary | ICD-10-CM | POA: Diagnosis not present

## 2019-07-22 DIAGNOSIS — M545 Low back pain: Secondary | ICD-10-CM | POA: Diagnosis not present

## 2019-07-22 DIAGNOSIS — M9901 Segmental and somatic dysfunction of cervical region: Secondary | ICD-10-CM | POA: Diagnosis not present

## 2019-07-22 DIAGNOSIS — M542 Cervicalgia: Secondary | ICD-10-CM | POA: Diagnosis not present

## 2019-07-22 DIAGNOSIS — M9902 Segmental and somatic dysfunction of thoracic region: Secondary | ICD-10-CM | POA: Diagnosis not present

## 2019-07-22 DIAGNOSIS — M546 Pain in thoracic spine: Secondary | ICD-10-CM | POA: Diagnosis not present

## 2019-07-22 DIAGNOSIS — M9905 Segmental and somatic dysfunction of pelvic region: Secondary | ICD-10-CM | POA: Diagnosis not present

## 2019-07-22 DIAGNOSIS — M9903 Segmental and somatic dysfunction of lumbar region: Secondary | ICD-10-CM | POA: Diagnosis not present

## 2019-07-24 DIAGNOSIS — M546 Pain in thoracic spine: Secondary | ICD-10-CM | POA: Diagnosis not present

## 2019-07-24 DIAGNOSIS — M9902 Segmental and somatic dysfunction of thoracic region: Secondary | ICD-10-CM | POA: Diagnosis not present

## 2019-07-24 DIAGNOSIS — M9901 Segmental and somatic dysfunction of cervical region: Secondary | ICD-10-CM | POA: Diagnosis not present

## 2019-07-24 DIAGNOSIS — M9905 Segmental and somatic dysfunction of pelvic region: Secondary | ICD-10-CM | POA: Diagnosis not present

## 2019-07-24 DIAGNOSIS — M545 Low back pain: Secondary | ICD-10-CM | POA: Diagnosis not present

## 2019-07-24 DIAGNOSIS — M9903 Segmental and somatic dysfunction of lumbar region: Secondary | ICD-10-CM | POA: Diagnosis not present

## 2019-07-24 DIAGNOSIS — M9906 Segmental and somatic dysfunction of lower extremity: Secondary | ICD-10-CM | POA: Diagnosis not present

## 2019-07-26 DIAGNOSIS — M546 Pain in thoracic spine: Secondary | ICD-10-CM | POA: Diagnosis not present

## 2019-07-26 DIAGNOSIS — M545 Low back pain: Secondary | ICD-10-CM | POA: Diagnosis not present

## 2019-07-26 DIAGNOSIS — M9906 Segmental and somatic dysfunction of lower extremity: Secondary | ICD-10-CM | POA: Diagnosis not present

## 2019-07-26 DIAGNOSIS — M9903 Segmental and somatic dysfunction of lumbar region: Secondary | ICD-10-CM | POA: Diagnosis not present

## 2019-07-26 DIAGNOSIS — M9902 Segmental and somatic dysfunction of thoracic region: Secondary | ICD-10-CM | POA: Diagnosis not present

## 2019-07-26 DIAGNOSIS — M9901 Segmental and somatic dysfunction of cervical region: Secondary | ICD-10-CM | POA: Diagnosis not present

## 2019-07-26 DIAGNOSIS — M9905 Segmental and somatic dysfunction of pelvic region: Secondary | ICD-10-CM | POA: Diagnosis not present

## 2019-07-29 DIAGNOSIS — M9903 Segmental and somatic dysfunction of lumbar region: Secondary | ICD-10-CM | POA: Diagnosis not present

## 2019-07-29 DIAGNOSIS — M9906 Segmental and somatic dysfunction of lower extremity: Secondary | ICD-10-CM | POA: Diagnosis not present

## 2019-07-29 DIAGNOSIS — M9901 Segmental and somatic dysfunction of cervical region: Secondary | ICD-10-CM | POA: Diagnosis not present

## 2019-07-29 DIAGNOSIS — M9905 Segmental and somatic dysfunction of pelvic region: Secondary | ICD-10-CM | POA: Diagnosis not present

## 2019-07-29 DIAGNOSIS — M546 Pain in thoracic spine: Secondary | ICD-10-CM | POA: Diagnosis not present

## 2019-07-29 DIAGNOSIS — M9902 Segmental and somatic dysfunction of thoracic region: Secondary | ICD-10-CM | POA: Diagnosis not present

## 2019-07-29 DIAGNOSIS — M545 Low back pain: Secondary | ICD-10-CM | POA: Diagnosis not present

## 2019-07-31 DIAGNOSIS — M9902 Segmental and somatic dysfunction of thoracic region: Secondary | ICD-10-CM | POA: Diagnosis not present

## 2019-07-31 DIAGNOSIS — M9906 Segmental and somatic dysfunction of lower extremity: Secondary | ICD-10-CM | POA: Diagnosis not present

## 2019-07-31 DIAGNOSIS — M9903 Segmental and somatic dysfunction of lumbar region: Secondary | ICD-10-CM | POA: Diagnosis not present

## 2019-07-31 DIAGNOSIS — M546 Pain in thoracic spine: Secondary | ICD-10-CM | POA: Diagnosis not present

## 2019-07-31 DIAGNOSIS — M9905 Segmental and somatic dysfunction of pelvic region: Secondary | ICD-10-CM | POA: Diagnosis not present

## 2019-07-31 DIAGNOSIS — M545 Low back pain: Secondary | ICD-10-CM | POA: Diagnosis not present

## 2019-07-31 DIAGNOSIS — M9901 Segmental and somatic dysfunction of cervical region: Secondary | ICD-10-CM | POA: Diagnosis not present

## 2019-08-02 DIAGNOSIS — M9903 Segmental and somatic dysfunction of lumbar region: Secondary | ICD-10-CM | POA: Diagnosis not present

## 2019-08-02 DIAGNOSIS — M9901 Segmental and somatic dysfunction of cervical region: Secondary | ICD-10-CM | POA: Diagnosis not present

## 2019-08-02 DIAGNOSIS — M9905 Segmental and somatic dysfunction of pelvic region: Secondary | ICD-10-CM | POA: Diagnosis not present

## 2019-08-02 DIAGNOSIS — M9906 Segmental and somatic dysfunction of lower extremity: Secondary | ICD-10-CM | POA: Diagnosis not present

## 2019-08-02 DIAGNOSIS — M545 Low back pain: Secondary | ICD-10-CM | POA: Diagnosis not present

## 2019-08-02 DIAGNOSIS — M9902 Segmental and somatic dysfunction of thoracic region: Secondary | ICD-10-CM | POA: Diagnosis not present

## 2019-08-02 DIAGNOSIS — M546 Pain in thoracic spine: Secondary | ICD-10-CM | POA: Diagnosis not present

## 2019-08-07 DIAGNOSIS — M9901 Segmental and somatic dysfunction of cervical region: Secondary | ICD-10-CM | POA: Diagnosis not present

## 2019-08-07 DIAGNOSIS — M9902 Segmental and somatic dysfunction of thoracic region: Secondary | ICD-10-CM | POA: Diagnosis not present

## 2019-08-07 DIAGNOSIS — M545 Low back pain: Secondary | ICD-10-CM | POA: Diagnosis not present

## 2019-08-07 DIAGNOSIS — M9905 Segmental and somatic dysfunction of pelvic region: Secondary | ICD-10-CM | POA: Diagnosis not present

## 2019-08-07 DIAGNOSIS — M546 Pain in thoracic spine: Secondary | ICD-10-CM | POA: Diagnosis not present

## 2019-08-07 DIAGNOSIS — M9906 Segmental and somatic dysfunction of lower extremity: Secondary | ICD-10-CM | POA: Diagnosis not present

## 2019-08-07 DIAGNOSIS — M9903 Segmental and somatic dysfunction of lumbar region: Secondary | ICD-10-CM | POA: Diagnosis not present

## 2019-08-09 DIAGNOSIS — M9906 Segmental and somatic dysfunction of lower extremity: Secondary | ICD-10-CM | POA: Diagnosis not present

## 2019-08-09 DIAGNOSIS — M9903 Segmental and somatic dysfunction of lumbar region: Secondary | ICD-10-CM | POA: Diagnosis not present

## 2019-08-09 DIAGNOSIS — M9902 Segmental and somatic dysfunction of thoracic region: Secondary | ICD-10-CM | POA: Diagnosis not present

## 2019-08-09 DIAGNOSIS — M9905 Segmental and somatic dysfunction of pelvic region: Secondary | ICD-10-CM | POA: Diagnosis not present

## 2019-08-09 DIAGNOSIS — M9901 Segmental and somatic dysfunction of cervical region: Secondary | ICD-10-CM | POA: Diagnosis not present

## 2019-08-09 DIAGNOSIS — M545 Low back pain: Secondary | ICD-10-CM | POA: Diagnosis not present

## 2019-08-14 DIAGNOSIS — M545 Low back pain: Secondary | ICD-10-CM | POA: Diagnosis not present

## 2019-08-14 DIAGNOSIS — M9905 Segmental and somatic dysfunction of pelvic region: Secondary | ICD-10-CM | POA: Diagnosis not present

## 2019-08-14 DIAGNOSIS — M9902 Segmental and somatic dysfunction of thoracic region: Secondary | ICD-10-CM | POA: Diagnosis not present

## 2019-08-14 DIAGNOSIS — M9901 Segmental and somatic dysfunction of cervical region: Secondary | ICD-10-CM | POA: Diagnosis not present

## 2019-08-14 DIAGNOSIS — M9903 Segmental and somatic dysfunction of lumbar region: Secondary | ICD-10-CM | POA: Diagnosis not present

## 2019-08-14 DIAGNOSIS — M9906 Segmental and somatic dysfunction of lower extremity: Secondary | ICD-10-CM | POA: Diagnosis not present

## 2019-08-19 DIAGNOSIS — M9901 Segmental and somatic dysfunction of cervical region: Secondary | ICD-10-CM | POA: Diagnosis not present

## 2019-08-19 DIAGNOSIS — M9905 Segmental and somatic dysfunction of pelvic region: Secondary | ICD-10-CM | POA: Diagnosis not present

## 2019-08-19 DIAGNOSIS — M9906 Segmental and somatic dysfunction of lower extremity: Secondary | ICD-10-CM | POA: Diagnosis not present

## 2019-08-19 DIAGNOSIS — M545 Low back pain: Secondary | ICD-10-CM | POA: Diagnosis not present

## 2019-08-19 DIAGNOSIS — M9902 Segmental and somatic dysfunction of thoracic region: Secondary | ICD-10-CM | POA: Diagnosis not present

## 2019-08-19 DIAGNOSIS — M9903 Segmental and somatic dysfunction of lumbar region: Secondary | ICD-10-CM | POA: Diagnosis not present

## 2019-08-21 DIAGNOSIS — M9901 Segmental and somatic dysfunction of cervical region: Secondary | ICD-10-CM | POA: Diagnosis not present

## 2019-08-21 DIAGNOSIS — M9905 Segmental and somatic dysfunction of pelvic region: Secondary | ICD-10-CM | POA: Diagnosis not present

## 2019-08-21 DIAGNOSIS — M545 Low back pain: Secondary | ICD-10-CM | POA: Diagnosis not present

## 2019-08-21 DIAGNOSIS — M9902 Segmental and somatic dysfunction of thoracic region: Secondary | ICD-10-CM | POA: Diagnosis not present

## 2019-08-21 DIAGNOSIS — M9903 Segmental and somatic dysfunction of lumbar region: Secondary | ICD-10-CM | POA: Diagnosis not present

## 2019-08-21 DIAGNOSIS — M9906 Segmental and somatic dysfunction of lower extremity: Secondary | ICD-10-CM | POA: Diagnosis not present

## 2019-08-23 DIAGNOSIS — M9903 Segmental and somatic dysfunction of lumbar region: Secondary | ICD-10-CM | POA: Diagnosis not present

## 2019-08-23 DIAGNOSIS — M9906 Segmental and somatic dysfunction of lower extremity: Secondary | ICD-10-CM | POA: Diagnosis not present

## 2019-08-23 DIAGNOSIS — M545 Low back pain: Secondary | ICD-10-CM | POA: Diagnosis not present

## 2019-08-23 DIAGNOSIS — M9901 Segmental and somatic dysfunction of cervical region: Secondary | ICD-10-CM | POA: Diagnosis not present

## 2019-08-23 DIAGNOSIS — M9905 Segmental and somatic dysfunction of pelvic region: Secondary | ICD-10-CM | POA: Diagnosis not present

## 2019-08-23 DIAGNOSIS — M9902 Segmental and somatic dysfunction of thoracic region: Secondary | ICD-10-CM | POA: Diagnosis not present

## 2019-08-28 DIAGNOSIS — M9902 Segmental and somatic dysfunction of thoracic region: Secondary | ICD-10-CM | POA: Diagnosis not present

## 2019-08-28 DIAGNOSIS — M9903 Segmental and somatic dysfunction of lumbar region: Secondary | ICD-10-CM | POA: Diagnosis not present

## 2019-08-28 DIAGNOSIS — M9901 Segmental and somatic dysfunction of cervical region: Secondary | ICD-10-CM | POA: Diagnosis not present

## 2019-08-28 DIAGNOSIS — M9906 Segmental and somatic dysfunction of lower extremity: Secondary | ICD-10-CM | POA: Diagnosis not present

## 2019-08-28 DIAGNOSIS — M545 Low back pain: Secondary | ICD-10-CM | POA: Diagnosis not present

## 2019-08-28 DIAGNOSIS — M9905 Segmental and somatic dysfunction of pelvic region: Secondary | ICD-10-CM | POA: Diagnosis not present

## 2019-08-30 DIAGNOSIS — M9903 Segmental and somatic dysfunction of lumbar region: Secondary | ICD-10-CM | POA: Diagnosis not present

## 2019-08-30 DIAGNOSIS — M9901 Segmental and somatic dysfunction of cervical region: Secondary | ICD-10-CM | POA: Diagnosis not present

## 2019-08-30 DIAGNOSIS — M9905 Segmental and somatic dysfunction of pelvic region: Secondary | ICD-10-CM | POA: Diagnosis not present

## 2019-08-30 DIAGNOSIS — M9902 Segmental and somatic dysfunction of thoracic region: Secondary | ICD-10-CM | POA: Diagnosis not present

## 2019-08-30 DIAGNOSIS — M9906 Segmental and somatic dysfunction of lower extremity: Secondary | ICD-10-CM | POA: Diagnosis not present

## 2019-08-30 DIAGNOSIS — M545 Low back pain: Secondary | ICD-10-CM | POA: Diagnosis not present

## 2019-09-02 DIAGNOSIS — M9903 Segmental and somatic dysfunction of lumbar region: Secondary | ICD-10-CM | POA: Diagnosis not present

## 2019-09-02 DIAGNOSIS — M9906 Segmental and somatic dysfunction of lower extremity: Secondary | ICD-10-CM | POA: Diagnosis not present

## 2019-09-02 DIAGNOSIS — M9905 Segmental and somatic dysfunction of pelvic region: Secondary | ICD-10-CM | POA: Diagnosis not present

## 2019-09-02 DIAGNOSIS — M9902 Segmental and somatic dysfunction of thoracic region: Secondary | ICD-10-CM | POA: Diagnosis not present

## 2019-09-02 DIAGNOSIS — M545 Low back pain: Secondary | ICD-10-CM | POA: Diagnosis not present

## 2019-09-02 DIAGNOSIS — M9901 Segmental and somatic dysfunction of cervical region: Secondary | ICD-10-CM | POA: Diagnosis not present

## 2019-09-06 DIAGNOSIS — M9902 Segmental and somatic dysfunction of thoracic region: Secondary | ICD-10-CM | POA: Diagnosis not present

## 2019-09-06 DIAGNOSIS — M9903 Segmental and somatic dysfunction of lumbar region: Secondary | ICD-10-CM | POA: Diagnosis not present

## 2019-09-06 DIAGNOSIS — M9901 Segmental and somatic dysfunction of cervical region: Secondary | ICD-10-CM | POA: Diagnosis not present

## 2019-09-06 DIAGNOSIS — M9905 Segmental and somatic dysfunction of pelvic region: Secondary | ICD-10-CM | POA: Diagnosis not present

## 2019-09-06 DIAGNOSIS — M9906 Segmental and somatic dysfunction of lower extremity: Secondary | ICD-10-CM | POA: Diagnosis not present

## 2019-09-06 DIAGNOSIS — M545 Low back pain: Secondary | ICD-10-CM | POA: Diagnosis not present

## 2019-09-09 DIAGNOSIS — M9903 Segmental and somatic dysfunction of lumbar region: Secondary | ICD-10-CM | POA: Diagnosis not present

## 2019-09-09 DIAGNOSIS — M9901 Segmental and somatic dysfunction of cervical region: Secondary | ICD-10-CM | POA: Diagnosis not present

## 2019-09-09 DIAGNOSIS — M9906 Segmental and somatic dysfunction of lower extremity: Secondary | ICD-10-CM | POA: Diagnosis not present

## 2019-09-09 DIAGNOSIS — M9902 Segmental and somatic dysfunction of thoracic region: Secondary | ICD-10-CM | POA: Diagnosis not present

## 2019-09-09 DIAGNOSIS — M545 Low back pain: Secondary | ICD-10-CM | POA: Diagnosis not present

## 2019-09-09 DIAGNOSIS — M9905 Segmental and somatic dysfunction of pelvic region: Secondary | ICD-10-CM | POA: Diagnosis not present

## 2019-09-13 DIAGNOSIS — M9903 Segmental and somatic dysfunction of lumbar region: Secondary | ICD-10-CM | POA: Diagnosis not present

## 2019-09-13 DIAGNOSIS — M9902 Segmental and somatic dysfunction of thoracic region: Secondary | ICD-10-CM | POA: Diagnosis not present

## 2019-09-13 DIAGNOSIS — M9906 Segmental and somatic dysfunction of lower extremity: Secondary | ICD-10-CM | POA: Diagnosis not present

## 2019-09-13 DIAGNOSIS — M9901 Segmental and somatic dysfunction of cervical region: Secondary | ICD-10-CM | POA: Diagnosis not present

## 2019-09-13 DIAGNOSIS — M545 Low back pain: Secondary | ICD-10-CM | POA: Diagnosis not present

## 2019-09-13 DIAGNOSIS — M9905 Segmental and somatic dysfunction of pelvic region: Secondary | ICD-10-CM | POA: Diagnosis not present

## 2019-09-16 DIAGNOSIS — M9902 Segmental and somatic dysfunction of thoracic region: Secondary | ICD-10-CM | POA: Diagnosis not present

## 2019-09-16 DIAGNOSIS — M545 Low back pain: Secondary | ICD-10-CM | POA: Diagnosis not present

## 2019-09-16 DIAGNOSIS — M9901 Segmental and somatic dysfunction of cervical region: Secondary | ICD-10-CM | POA: Diagnosis not present

## 2019-09-16 DIAGNOSIS — M9905 Segmental and somatic dysfunction of pelvic region: Secondary | ICD-10-CM | POA: Diagnosis not present

## 2019-09-16 DIAGNOSIS — M9903 Segmental and somatic dysfunction of lumbar region: Secondary | ICD-10-CM | POA: Diagnosis not present

## 2019-09-16 DIAGNOSIS — M9906 Segmental and somatic dysfunction of lower extremity: Secondary | ICD-10-CM | POA: Diagnosis not present

## 2019-09-20 DIAGNOSIS — M9902 Segmental and somatic dysfunction of thoracic region: Secondary | ICD-10-CM | POA: Diagnosis not present

## 2019-09-20 DIAGNOSIS — M545 Low back pain: Secondary | ICD-10-CM | POA: Diagnosis not present

## 2019-09-20 DIAGNOSIS — M9905 Segmental and somatic dysfunction of pelvic region: Secondary | ICD-10-CM | POA: Diagnosis not present

## 2019-09-20 DIAGNOSIS — M9903 Segmental and somatic dysfunction of lumbar region: Secondary | ICD-10-CM | POA: Diagnosis not present

## 2019-09-20 DIAGNOSIS — M9901 Segmental and somatic dysfunction of cervical region: Secondary | ICD-10-CM | POA: Diagnosis not present

## 2019-09-20 DIAGNOSIS — M9906 Segmental and somatic dysfunction of lower extremity: Secondary | ICD-10-CM | POA: Diagnosis not present

## 2019-09-23 DIAGNOSIS — M9901 Segmental and somatic dysfunction of cervical region: Secondary | ICD-10-CM | POA: Diagnosis not present

## 2019-09-23 DIAGNOSIS — M9905 Segmental and somatic dysfunction of pelvic region: Secondary | ICD-10-CM | POA: Diagnosis not present

## 2019-09-23 DIAGNOSIS — M545 Low back pain: Secondary | ICD-10-CM | POA: Diagnosis not present

## 2019-09-23 DIAGNOSIS — M9902 Segmental and somatic dysfunction of thoracic region: Secondary | ICD-10-CM | POA: Diagnosis not present

## 2019-09-23 DIAGNOSIS — M9903 Segmental and somatic dysfunction of lumbar region: Secondary | ICD-10-CM | POA: Diagnosis not present

## 2019-09-23 DIAGNOSIS — M9906 Segmental and somatic dysfunction of lower extremity: Secondary | ICD-10-CM | POA: Diagnosis not present

## 2019-09-27 DIAGNOSIS — M545 Low back pain: Secondary | ICD-10-CM | POA: Diagnosis not present

## 2019-09-27 DIAGNOSIS — M9905 Segmental and somatic dysfunction of pelvic region: Secondary | ICD-10-CM | POA: Diagnosis not present

## 2019-09-27 DIAGNOSIS — M9903 Segmental and somatic dysfunction of lumbar region: Secondary | ICD-10-CM | POA: Diagnosis not present

## 2019-09-27 DIAGNOSIS — M9901 Segmental and somatic dysfunction of cervical region: Secondary | ICD-10-CM | POA: Diagnosis not present

## 2019-09-27 DIAGNOSIS — M9906 Segmental and somatic dysfunction of lower extremity: Secondary | ICD-10-CM | POA: Diagnosis not present

## 2019-09-27 DIAGNOSIS — M9902 Segmental and somatic dysfunction of thoracic region: Secondary | ICD-10-CM | POA: Diagnosis not present

## 2019-10-02 DIAGNOSIS — M9905 Segmental and somatic dysfunction of pelvic region: Secondary | ICD-10-CM | POA: Diagnosis not present

## 2019-10-02 DIAGNOSIS — M9902 Segmental and somatic dysfunction of thoracic region: Secondary | ICD-10-CM | POA: Diagnosis not present

## 2019-10-02 DIAGNOSIS — M9903 Segmental and somatic dysfunction of lumbar region: Secondary | ICD-10-CM | POA: Diagnosis not present

## 2019-10-02 DIAGNOSIS — M9901 Segmental and somatic dysfunction of cervical region: Secondary | ICD-10-CM | POA: Diagnosis not present

## 2019-10-02 DIAGNOSIS — M545 Low back pain: Secondary | ICD-10-CM | POA: Diagnosis not present

## 2019-10-02 DIAGNOSIS — M9906 Segmental and somatic dysfunction of lower extremity: Secondary | ICD-10-CM | POA: Diagnosis not present

## 2019-10-04 DIAGNOSIS — M9903 Segmental and somatic dysfunction of lumbar region: Secondary | ICD-10-CM | POA: Diagnosis not present

## 2019-10-04 DIAGNOSIS — M9902 Segmental and somatic dysfunction of thoracic region: Secondary | ICD-10-CM | POA: Diagnosis not present

## 2019-10-04 DIAGNOSIS — M9906 Segmental and somatic dysfunction of lower extremity: Secondary | ICD-10-CM | POA: Diagnosis not present

## 2019-10-04 DIAGNOSIS — M545 Low back pain: Secondary | ICD-10-CM | POA: Diagnosis not present

## 2019-10-04 DIAGNOSIS — M9901 Segmental and somatic dysfunction of cervical region: Secondary | ICD-10-CM | POA: Diagnosis not present

## 2019-10-04 DIAGNOSIS — M542 Cervicalgia: Secondary | ICD-10-CM | POA: Diagnosis not present

## 2019-10-04 DIAGNOSIS — M9905 Segmental and somatic dysfunction of pelvic region: Secondary | ICD-10-CM | POA: Diagnosis not present

## 2019-10-07 DIAGNOSIS — M542 Cervicalgia: Secondary | ICD-10-CM | POA: Diagnosis not present

## 2019-10-07 DIAGNOSIS — M9901 Segmental and somatic dysfunction of cervical region: Secondary | ICD-10-CM | POA: Diagnosis not present

## 2019-10-07 DIAGNOSIS — M9905 Segmental and somatic dysfunction of pelvic region: Secondary | ICD-10-CM | POA: Diagnosis not present

## 2019-10-07 DIAGNOSIS — M9903 Segmental and somatic dysfunction of lumbar region: Secondary | ICD-10-CM | POA: Diagnosis not present

## 2019-10-07 DIAGNOSIS — M9906 Segmental and somatic dysfunction of lower extremity: Secondary | ICD-10-CM | POA: Diagnosis not present

## 2019-10-07 DIAGNOSIS — M545 Low back pain: Secondary | ICD-10-CM | POA: Diagnosis not present

## 2019-10-07 DIAGNOSIS — M9902 Segmental and somatic dysfunction of thoracic region: Secondary | ICD-10-CM | POA: Diagnosis not present

## 2019-10-10 ENCOUNTER — Other Ambulatory Visit: Payer: Self-pay | Admitting: Registered Nurse

## 2019-10-10 DIAGNOSIS — E291 Testicular hypofunction: Secondary | ICD-10-CM

## 2019-11-13 DIAGNOSIS — H5203 Hypermetropia, bilateral: Secondary | ICD-10-CM | POA: Diagnosis not present

## 2019-12-26 ENCOUNTER — Other Ambulatory Visit: Payer: Self-pay | Admitting: Physician Assistant

## 2019-12-26 DIAGNOSIS — E291 Testicular hypofunction: Secondary | ICD-10-CM

## 2020-01-03 ENCOUNTER — Telehealth: Payer: Self-pay

## 2020-01-03 NOTE — Telephone Encounter (Signed)
Already an entry in [facility] - routed it to [employer], [employer].  AMD

## 2020-01-24 DIAGNOSIS — L2084 Intrinsic (allergic) eczema: Secondary | ICD-10-CM | POA: Diagnosis not present

## 2020-01-24 DIAGNOSIS — D229 Melanocytic nevi, unspecified: Secondary | ICD-10-CM | POA: Diagnosis not present

## 2020-01-24 DIAGNOSIS — L821 Other seborrheic keratosis: Secondary | ICD-10-CM | POA: Diagnosis not present

## 2020-01-24 DIAGNOSIS — D224 Melanocytic nevi of scalp and neck: Secondary | ICD-10-CM | POA: Diagnosis not present

## 2020-01-24 DIAGNOSIS — L814 Other melanin hyperpigmentation: Secondary | ICD-10-CM | POA: Diagnosis not present

## 2020-01-24 DIAGNOSIS — X32XXXA Exposure to sunlight, initial encounter: Secondary | ICD-10-CM | POA: Diagnosis not present

## 2020-01-24 DIAGNOSIS — L905 Scar conditions and fibrosis of skin: Secondary | ICD-10-CM | POA: Diagnosis not present

## 2020-01-24 DIAGNOSIS — D485 Neoplasm of uncertain behavior of skin: Secondary | ICD-10-CM | POA: Diagnosis not present

## 2020-02-12 ENCOUNTER — Ambulatory Visit: Payer: Self-pay

## 2020-02-12 DIAGNOSIS — Z23 Encounter for immunization: Secondary | ICD-10-CM

## 2020-02-17 ENCOUNTER — Encounter: Payer: Self-pay | Admitting: Nurse Practitioner

## 2020-02-17 ENCOUNTER — Ambulatory Visit: Payer: 59

## 2020-02-17 ENCOUNTER — Ambulatory Visit: Payer: Self-pay | Admitting: Nurse Practitioner

## 2020-02-17 ENCOUNTER — Other Ambulatory Visit: Payer: Self-pay

## 2020-02-17 VITALS — BP 127/86 | HR 72 | Temp 97.0°F | Resp 12 | Ht 74.0 in | Wt 210.0 lb

## 2020-02-17 DIAGNOSIS — M546 Pain in thoracic spine: Secondary | ICD-10-CM

## 2020-02-17 DIAGNOSIS — S29019A Strain of muscle and tendon of unspecified wall of thorax, initial encounter: Secondary | ICD-10-CM

## 2020-02-17 LAB — POCT URINALYSIS DIPSTICK
Bilirubin, UA: NEGATIVE
Blood, UA: NEGATIVE
Glucose, UA: NEGATIVE
Ketones, UA: NEGATIVE
Leukocytes, UA: NEGATIVE
Nitrite, UA: NEGATIVE
Protein, UA: NEGATIVE
Spec Grav, UA: 1.025 (ref 1.010–1.025)
Urobilinogen, UA: 0.2 E.U./dL
pH, UA: 6 (ref 5.0–8.0)

## 2020-02-17 MED ORDER — CYCLOBENZAPRINE HCL 5 MG PO TABS: 5.0000 mg | ORAL_TABLET | Freq: Three times a day (TID) | ORAL | 1 refills | Status: DC | PRN

## 2020-02-17 NOTE — Progress Notes (Signed)
  Subjective:     Patient ID: Brandon Welch, male   DOB: 04/06/1970, 49 y.o.   MRN: [id number]  HPI Brandon Welch is a 49 y.o. male who presents to the [facility] with c/o low back pain. The pain started 4 days ago and has continued. He denies difficulty with urination, dysuria or frequency. No hx of UTI or kidney stones. He did have his flu vaccine and Covid vaccine last week.   Past Medical History:  Diagnosis Date  . Arthritis   . Low testosterone    Past Surgical History:  Procedure Laterality Date  . EYE SURGERY Left 1975  . INGUINAL HERNIA REPAIR Left 1977   Inguinal Hernia Repair  . KNEE ARTHROSCOPY WITH ANTERIOR CRUCIATE LIGAMENT (ACL) REPAIR WITH HAMSTRING GRAFT Right 01/15/2015   Procedure: KNEE ARTHROSCOPY WITH ANTERIOR CRUCIATE LIGAMENT (ACL) REPAIR WITH HAMSTRING AUTOGRAFT;  Surgeon: Thornton Park, MD;  Location: [facility] ORS;  Service: Orthopedics;  Laterality: Right;   Current Outpatient Medications on File Prior to Visit  Medication Sig Dispense Refill  . cetirizine (ZYRTEC) 10 MG tablet Take 1 tablet (10 mg total) by mouth daily. 30 tablet 1  . Crisaborole (EUCRISA) 2 % OINT Apply 1 application topically 2 (two) times daily. 60 g 1  . mometasone (ELOCON) 0.1 % cream Apply 1 application topically daily. 45 g 0  . Testosterone 1.62 % GEL APPLY 4 PUMPS TOPICALLY TO THE AFFECTED AREA EVERY MORNING 150 g 2   No current facility-administered medications on file prior to visit.   Allergies  Allergen Reactions  . Aloe Rash   Review of Systems  Musculoskeletal: Positive for back pain.  All other systems reviewed and are negative.      Objective:   Physical Exam Vitals and nursing note reviewed.  Constitutional:      General: He is not in acute distress.    Appearance: Normal appearance. He is normal weight.  HENT:     Head: Normocephalic.  Cardiovascular:     Rate and Rhythm: Normal rate and regular rhythm.  Pulmonary:     Effort: Pulmonary effort is normal.      Breath sounds: Normal breath sounds.  Abdominal:     Tenderness: There is no right CVA tenderness or left CVA tenderness.  Musculoskeletal:     Cervical back: Normal and normal range of motion.     Thoracic back: Spasms and tenderness (right) present. No deformity. Normal range of motion. No scoliosis.     Lumbar back: Normal.  Skin:    General: Skin is warm and dry.  Neurological:     Mental Status: He is alert.  Psychiatric:        Mood and Affect: Mood normal.    ASSESSMENT: 49 y.o. male here with right thoracic pain x 4 days. PLAN: For acute pain, rest, intermittent application of cold packs (later, may switch to heat, but do not sleep on heating pad), analgesics and muscle relaxants are recommended. Discussed longer term treatment plan of prn NSAID's and discussed Physical Therapy and  if not improving. Call or return to clinic prn if these symptoms worsen or fail to improve as anticipated.

## 2020-02-17 NOTE — Progress Notes (Signed)
Low back pain started Friday morning. No issues urinating. Hasn't noticed blood in urine No Hx of kidney stones.  Has had back pain before but not like this.  Doesn't hurt when lying down. Hurts with certain movements & gets sharper when turns certain ways. Constant pain.  Did get both Flu vaccine & covid booster this past Wednesday.  Took some tylenol over the weekend with minimal relief. No heat or ice used.  No known injury  AMD

## 2020-02-17 NOTE — Patient Instructions (Signed)
Acute Back Pain, Adult Acute back pain is sudden and usually short-lived. It is often caused by an injury to the muscles and tissues in the back. The injury may result from:  A muscle or ligament getting overstretched or torn (strained). Ligaments are tissues that connect bones to each other. Lifting something improperly can cause a back strain.  Wear and tear (degeneration) of the spinal disks. Spinal disks are circular tissue that provides cushioning between the bones of the spine (vertebrae).  Twisting motions, such as while playing sports or doing yard work.  A hit to the back.  Arthritis. You may have a physical exam, lab tests, and imaging tests to find the cause of your pain. Acute back pain usually goes away with rest and home care. Follow these instructions at home: Managing pain, stiffness, and swelling  Take over-the-counter and prescription medicines only as told by your health care provider.  Your health care provider may recommend applying ice during the first 24-48 hours after your pain starts. To do this: ? Put ice in a plastic bag. ? Place a towel between your skin and the bag. ? Leave the ice on for 20 minutes, 2-3 times a day.  If directed, apply heat to the affected area as often as told by your health care provider. Use the heat source that your health care provider recommends, such as a moist heat pack or a heating pad. ? Place a towel between your skin and the heat source. ? Leave the heat on for 20-30 minutes. ? Remove the heat if your skin turns bright red. This is especially important if you are unable to feel pain, heat, or cold. You have a greater risk of getting burned. Activity   Do not stay in bed. Staying in bed for more than 1-2 days can delay your recovery.  Sit up and stand up straight. Avoid leaning forward when you sit, or hunching over when you stand. ? If you work at a desk, sit close to it so you do not need to lean over. Keep your chin tucked  in. Keep your neck drawn back, and keep your elbows bent at a right angle. Your arms should look like the letter "L." ? Sit high and close to the steering wheel when you drive. Add lower back (lumbar) support to your car seat, if needed.  Take short walks on even surfaces as soon as you are able. Try to increase the length of time you walk each day.  Do not sit, drive, or stand in one place for more than 30 minutes at a time. Sitting or standing for long periods of time can put stress on your back.  Do not drive or use heavy machinery while taking prescription pain medicine.  Use proper lifting techniques. When you bend and lift, use positions that put less stress on your back: ? Bend your knees. ? Keep the load close to your body. ? Avoid twisting.  Exercise regularly as told by your health care provider. Exercising helps your back heal faster and helps prevent back injuries by keeping muscles strong and flexible.  Work with a physical therapist to make a safe exercise program, as recommended by your health care provider. Do any exercises as told by your physical therapist. Lifestyle  Maintain a healthy weight. Extra weight puts stress on your back and makes it difficult to have good posture.  Avoid activities or situations that make you feel anxious or stressed. Stress and anxiety increase muscle   tension and can make back pain worse. Learn ways to manage anxiety and stress, such as through exercise. General instructions  Sleep on a firm mattress in a comfortable position. Try lying on your side with your knees slightly bent. If you lie on your back, put a pillow under your knees.  Follow your treatment plan as told by your health care provider. This may include: ? Cognitive or behavioral therapy. ? Acupuncture or massage therapy. ? Meditation or yoga. Contact a health care provider if:  You have pain that is not relieved with rest or medicine.  You have increasing pain going down  into your legs or buttocks.  Your pain does not improve after 2 weeks.  You have pain at night.  You lose weight without trying.  You have a fever or chills. Get help right away if:  You develop new bowel or bladder control problems.  You have unusual weakness or numbness in your arms or legs.  You develop nausea or vomiting.  You develop abdominal pain.  You feel faint. Summary  Acute back pain is sudden and usually short-lived.  Use proper lifting techniques. When you bend and lift, use positions that put less stress on your back.  Take over-the-counter and prescription medicines and apply heat or ice as directed by your health care provider. This information is not intended to replace advice given to you by your health care provider. Make sure you discuss any questions you have with your health care provider. Document Revised: 07/03/2018 Document Reviewed: 10/26/2016 Elsevier Patient Education  [place] therapy is the use of heat to help ease sore, stiff, injured, and tight muscles and joints. Heat relaxes muscles, which may help relieve pain and muscle spasms. What are the risks? If you have any of the following conditions, do not use heat therapy unless your health care provider approves:  New bruises.  Open wounds.  Healing wounds, infected skin, or scarred skin in the area being treated.  Poor circulation.  Numbness in the area being treated.  Unusual swelling of the area being treated.  Blood clots.  Diabetes or heart disease.  Cancer.  Inability to communicate pain. This may include young children and people who have problems with their brain function (dementia). How to use heat therapy Use the heat source that your health care provider recommends, such as:  Moist heat pack.  Hot water bottle.  Electric heating pad.  Heated gel pack.  Heated wrap.  Warm water bath. Follow your health care provider's  instructions about when and how to use heat therapy. In general, you should: 1. Place a towel between your skin and the heat source. 2. Leave the heat on for 20-30 minutes. Your skin may turn pink. 3. Remove the heat if your skin turns bright red. This is especially important if you are unable to feel pain, heat, or cold. You may have a greater risk of getting burned. If directed, you can also soak in a warm water bath. To prepare: 1. Put a non-slip padding in the bathtub to prevent slips or falls. 2. Fill a bathtub with warm water. 3. Always check the water temperature before getting into the bathtub. 4. Soak in the water for 15-20 minutes, or for as long as you are told by your health care provider. 5. When you are done, be careful when you stand up. You may feel dizzy. 6. After the bath, pat yourself dry. Do not rub your skin  to dry it. General recommendations  Be careful to avoid burning your skin. High heat or long exposure to heat can cause burns.  Check your skin during heat therapy.  Do not use heat therapy: ? For new injuries, especially if you have swelling on the injured area. ? On areas of skin that are already irritated, such as with a rash or sunburn. ? While sleeping. Only use heat therapy while you are awake. ? If your skin turns bright red. Contact a health care provider if you have:  Blisters, redness, swelling, or numbness in the area where you use heat therapy.  New pain.  Pain that gets worse. Summary  Heat therapy is the use of heat to help ease sore, stiff, injured, and tight muscles and joints. Heat relaxes muscles, which may help relieve pain.  If you have the following conditions, do not use heat therapy unless your health care provider approves: poor circulation, healing wounds, diabetes, numbness or swelling in the area being treated, blood clots, cancer, or the inability to communicate pain.  Be careful to avoid burning your skin. Only use heat therapy  while you are awake.  Follow your health care provider's instructions about when and how to use heat therapy. This information is not intended to replace advice given to you by your health care provider. Make sure you discuss any questions you have with your health care provider. Document Revised: 05/07/2018 Document Reviewed: 03/25/2017 Elsevier Patient Education  2020 [place].  Muscle Strain A muscle strain is an injury that happens when a muscle is stretched longer than normal. This can happen during a fall, sports, or lifting. This can tear some muscle fibers. Usually, recovery from muscle strain takes 1-2 weeks. Complete healing normally takes 5-6 weeks. This condition is first treated with PRICE therapy. This involves:  Protecting your muscle from being injured again.  Resting your injured muscle.  Icing your injured muscle.  Applying pressure (compression) to your injured muscle. This may be done with a splint or elastic bandage.  Raising (elevating) your injured muscle. Your doctor may also recommend medicine for pain. Follow these instructions at home: If you have a splint:  Wear the splint as told by your doctor. Take it off only as told by your doctor.  Loosen the splint if your fingers or toes tingle, get numb, or turn cold and blue.  Keep the splint clean.  If the splint is not waterproof: ? Do not let it get wet. ? Cover it with a watertight covering when you take a bath or a shower. Managing pain, stiffness, and swelling   If directed, put ice on your injured area. ? If you have a removable splint, take it off as told by your doctor. ? Put ice in a plastic bag. ? Place a towel between your skin and the bag. ? Leave the ice on for 20 minutes, 2-3 times a day.  Move your fingers or toes often. This helps to avoid stiffness and lessen swelling.  Raise your injured area above the level of your heart while you are sitting or lying down.  Wear an elastic  bandage as told by your doctor. Make sure it is not too tight. General instructions  Take over-the-counter and prescription medicines only as told by your doctor.  Limit your activity. Rest your injured muscle as told by your doctor. Your doctor may say that gentle movements are okay.  If physical therapy was prescribed, do exercises as told by your  doctor.  Do not put pressure on any part of the splint until it is fully hardened. This may take many hours.  Do not use any products that contain nicotine or tobacco, such as cigarettes and e-cigarettes. These can delay bone healing. If you need help quitting, ask your doctor.  Warm up before you exercise. This helps to prevent more muscle strains.  Ask your doctor when it is safe to drive if you have a splint.  Keep all follow-up visits as told by your doctor. This is important. Contact a doctor if:  You have more pain or swelling in your injured area. Get help right away if:  You have any of these problems in your injured area: ? You have numbness. ? You have tingling. ? You lose a lot of strength. Summary  A muscle strain is an injury that happens when a muscle is stretched longer than normal.  This condition is first treated with PRICE therapy. This includes protecting, resting, icing, adding pressure, and raising your injury.  Limit your activity. Rest your injured muscle as told by your doctor. Your doctor may say that gentle movements are okay.  Warm up before you exercise. This helps to prevent more muscle strains. This information is not intended to replace advice given to you by your health care provider. Make sure you discuss any questions you have with your health care provider. Document Revised: 05/10/2018 Document Reviewed: 04/20/2016 Elsevier Patient Education  [place].

## 2020-03-04 DIAGNOSIS — Z03818 Encounter for observation for suspected exposure to other biological agents ruled out: Secondary | ICD-10-CM | POA: Diagnosis not present

## 2020-03-04 DIAGNOSIS — J209 Acute bronchitis, unspecified: Secondary | ICD-10-CM | POA: Diagnosis not present

## 2020-03-04 DIAGNOSIS — J019 Acute sinusitis, unspecified: Secondary | ICD-10-CM | POA: Diagnosis not present

## 2020-03-04 DIAGNOSIS — B9689 Other specified bacterial agents as the cause of diseases classified elsewhere: Secondary | ICD-10-CM | POA: Diagnosis not present

## 2020-04-28 ENCOUNTER — Other Ambulatory Visit: Payer: Self-pay

## 2020-04-28 DIAGNOSIS — E291 Testicular hypofunction: Secondary | ICD-10-CM

## 2020-04-28 MED ORDER — TESTOSTERONE 1.62 % TD GEL: TRANSDERMAL | 2 refills | Status: DC

## 2020-05-05 ENCOUNTER — Other Ambulatory Visit: Payer: Self-pay | Admitting: Adult Medicine

## 2020-05-05 ENCOUNTER — Other Ambulatory Visit: Payer: Self-pay

## 2020-05-05 DIAGNOSIS — E291 Testicular hypofunction: Secondary | ICD-10-CM

## 2020-05-05 MED ORDER — TESTOSTERONE 1.62 % TD GEL: TRANSDERMAL | 2 refills | Status: DC

## 2020-05-06 ENCOUNTER — Other Ambulatory Visit: Payer: Self-pay | Admitting: Adult Medicine

## 2020-05-06 ENCOUNTER — Telehealth: Payer: Self-pay

## 2020-05-06 ENCOUNTER — Other Ambulatory Visit: Payer: Self-pay

## 2020-05-06 DIAGNOSIS — L308 Other specified dermatitis: Secondary | ICD-10-CM

## 2020-05-06 DIAGNOSIS — E291 Testicular hypofunction: Secondary | ICD-10-CM

## 2020-05-06 MED ORDER — TRIAMCINOLONE ACETONIDE 0.1 % EX CREA: TOPICAL_CREAM | Freq: Two times a day (BID) | CUTANEOUS | 1 refills | Status: DC | PRN

## 2020-05-06 MED ORDER — TESTOSTERONE 1.62 % TD GEL: TRANSDERMAL | 2 refills | Status: DC

## 2020-05-06 NOTE — Telephone Encounter (Signed)
Dr. Noreene Larsson will refill Kenalog 0.1% strength until he sees [place].  Informed Dr that Ophelia Shoulder annual physical scheduled for 05/25/20.  Dr said he can come in sooner to discuss the Eczema or wait until the physical.  Tried to call [facility].  Went straight to voice mail.  Left call back message.  AMD

## 2020-05-12 ENCOUNTER — Other Ambulatory Visit: Payer: Self-pay

## 2020-05-12 DIAGNOSIS — E291 Testicular hypofunction: Secondary | ICD-10-CM

## 2020-05-12 MED ORDER — TESTOSTERONE 1.62 % TD GEL
TRANSDERMAL | 2 refills | Status: DC
Start: 2020-05-12 — End: 2020-08-27

## 2020-05-12 MED ORDER — TESTOSTERONE 1.62 % TD GEL: TRANSDERMAL | 2 refills | Status: DC

## 2020-05-12 NOTE — Telephone Encounter (Signed)
done

## 2020-05-18 ENCOUNTER — Other Ambulatory Visit: Payer: Self-pay

## 2020-05-18 ENCOUNTER — Ambulatory Visit: Payer: Self-pay

## 2020-05-18 DIAGNOSIS — Z0000 Encounter for general adult medical examination without abnormal findings: Secondary | ICD-10-CM

## 2020-05-18 LAB — POCT URINALYSIS DIPSTICK
Appearance: NEGATIVE
Bilirubin, UA: NEGATIVE
Blood, UA: NEGATIVE
Clarity, UA: NEGATIVE
Glucose, UA: NEGATIVE
Ketones, UA: NEGATIVE
Leukocytes, UA: NEGATIVE
Nitrite, UA: NEGATIVE
Protein, UA: NEGATIVE
Spec Grav, UA: 1.02 (ref 1.010–1.025)
Urobilinogen, UA: 0.2 E.U./dL
pH, UA: 6 (ref 5.0–8.0)

## 2020-05-18 NOTE — Progress Notes (Signed)
Pt scheduled to complete physical 05/25/20. CL,RMA

## 2020-05-20 LAB — CMP12+LP+TP+TSH+6AC+PSA+CBC…
ALT: 32 IU/L (ref 0–44)
AST: 53 IU/L — ABNORMAL HIGH (ref 0–40)
Albumin/Globulin Ratio: 2.6 — ABNORMAL HIGH (ref 1.2–2.2)
Albumin: 4.9 g/dL (ref 4.0–5.0)
Alkaline Phosphatase: 75 IU/L (ref 44–121)
BUN/Creatinine Ratio: 11 (ref 9–20)
BUN: 12 mg/dL (ref 6–24)
Basophils Absolute: 0.1 10*3/uL (ref 0.0–0.2)
Basos: 1 %
Bilirubin Total: 0.5 mg/dL (ref 0.0–1.2)
Calcium: 9.4 mg/dL (ref 8.7–10.2)
Chloride: 102 mmol/L (ref 96–106)
Chol/HDL Ratio: 3.7 ratio (ref 0.0–5.0)
Cholesterol, Total: 179 mg/dL (ref 100–199)
Creatinine, Ser: 1.09 mg/dL (ref 0.76–1.27)
EOS (ABSOLUTE): 0.2 10*3/uL (ref 0.0–0.4)
Eos: 3 %
Estimated CHD Risk: 0.6 times avg. (ref 0.0–1.0)
Free Thyroxine Index: 1.9 (ref 1.2–4.9)
GFR calc Af Amer: 92 mL/min/{1.73_m2} (ref >59)
GFR calc non Af Amer: 80 mL/min/{1.73_m2} (ref >59)
GGT: 12 IU/L (ref 0–65)
Globulin, Total: 1.9 g/dL (ref 1.5–4.5)
Glucose: 92 mg/dL (ref 65–99)
HDL: 48 mg/dL (ref >39)
Hematocrit: 42.7 % (ref 37.5–51.0)
Hemoglobin: 14.3 g/dL (ref 13.0–17.7)
Immature Grans (Abs): 0 10*3/uL (ref 0.0–0.1)
Immature Granulocytes: 0 %
Iron: 121 ug/dL (ref 38–169)
LDH: 192 IU/L (ref 121–224)
LDL Chol Calc (NIH): 113 mg/dL — ABNORMAL HIGH (ref 0–99)
Lymphocytes Absolute: 1.5 10*3/uL (ref 0.7–3.1)
Lymphs: 29 %
MCH: 27.9 pg (ref 26.6–33.0)
MCHC: 33.5 g/dL (ref 31.5–35.7)
MCV: 83 fL (ref 79–97)
Monocytes Absolute: 0.6 10*3/uL (ref 0.1–0.9)
Monocytes: 11 %
Neutrophils Absolute: 2.9 10*3/uL (ref 1.4–7.0)
Neutrophils: 56 %
Phosphorus: 3.1 mg/dL (ref 2.8–4.1)
Platelets: 219 10*3/uL (ref 150–450)
Potassium: 4.1 mmol/L (ref 3.5–5.2)
Prostate Specific Ag, Serum: 1.1 ng/mL (ref 0.0–4.0)
RBC: 5.13 x10E6/uL (ref 4.14–5.80)
RDW: 12.1 % (ref 11.6–15.4)
Sodium: 146 mmol/L — ABNORMAL HIGH (ref 134–144)
T3 Uptake Ratio: 29 % (ref 24–39)
T4, Total: 6.4 ug/dL (ref 4.5–12.0)
TSH: 1.62 u[IU]/mL (ref 0.450–4.500)
Total Protein: 6.8 g/dL (ref 6.0–8.5)
Triglycerides: 99 mg/dL (ref 0–149)
Uric Acid: 5.5 mg/dL (ref 3.8–8.4)
VLDL Cholesterol Cal: 18 mg/dL (ref 5–40)
WBC: 5.1 10*3/uL (ref 3.4–10.8)

## 2020-05-20 LAB — TESTOSTERONE,FREE AND TOTAL
Testosterone, Free: 11.2 pg/mL (ref 6.8–21.5)
Testosterone: 345 ng/dL (ref 264–916)

## 2020-05-25 ENCOUNTER — Ambulatory Visit: Payer: Self-pay | Admitting: Emergency Medicine

## 2020-05-25 ENCOUNTER — Encounter: Payer: Self-pay | Admitting: Emergency Medicine

## 2020-05-25 ENCOUNTER — Other Ambulatory Visit: Payer: Self-pay

## 2020-05-25 VITALS — BP 120/78 | HR 68 | Temp 98.0°F | Resp 12 | Ht 74.0 in | Wt 220.0 lb

## 2020-05-25 DIAGNOSIS — E291 Testicular hypofunction: Secondary | ICD-10-CM

## 2020-05-25 DIAGNOSIS — Z0000 Encounter for general adult medical examination without abnormal findings: Secondary | ICD-10-CM

## 2020-05-25 MED ORDER — TRIAMCINOLONE ACETONIDE 0.5 % EX CREA: 1.0000 "application " | TOPICAL_CREAM | Freq: Two times a day (BID) | CUTANEOUS | 2 refills | Status: DC

## 2020-05-25 NOTE — Progress Notes (Signed)
________________________  I have reviewed the triage vital signs and the nursing notes.   HISTORY  Chief Complaint Annual Exam   HPI Brandon Welch is a 50 y.o. male is here for annual physical.  Had one episode over the weekend with heart racing while moving furniture but resolved after stopping.  Had episode greater than 7 years ago and was worked up by cardiologist.  No problems found.  Admits out of shape and no excising and weight gain during pandemic.        Past Medical History:  Diagnosis Date  . Arthritis   . Low testosterone     Patient Active Problem List   Diagnosis Date Noted  . Hypogonadism in male 06/03/2019  . Hearing loss due to cerumen impaction, right 11/29/2018  . Acute upper respiratory infection 11/29/2018  . Dysfunction of right eustachian tube 11/29/2018  . History of repair of ACL 11/29/2018  . Scrotal pain 09/01/2014    Past Surgical History:  Procedure Laterality Date  . EYE SURGERY Left 1975  . INGUINAL HERNIA REPAIR Left 1977   Inguinal Hernia Repair  . KNEE ARTHROSCOPY WITH ANTERIOR CRUCIATE LIGAMENT (ACL) REPAIR WITH HAMSTRING GRAFT Right 01/15/2015   Procedure: KNEE ARTHROSCOPY WITH ANTERIOR CRUCIATE LIGAMENT (ACL) REPAIR WITH HAMSTRING AUTOGRAFT;  Surgeon: Thornton Park, MD;  Location: [facility] ORS;  Service: Orthopedics;  Laterality: Right;    Prior to Admission medications   Medication Sig Start Date End Date Taking? Authorizing Provider  Testosterone 1.62 % GEL APPLY 4 PUMPS TOPICALLY TO THE AFFECTED AREA EVERY MORNING 05/12/20  Yes Cecil Cobbs, MD  triamcinolone (KENALOG) 0.1 % Apply topically 2 (two) times daily as needed. 05/06/20  Yes Cecil Cobbs, MD  cetirizine (ZYRTEC) 10 MG tablet Take 1 tablet (10 mg total) by mouth daily. Patient not taking: Reported on 05/25/2020 04/08/19   Darlin Priestly, PA-C  cyclobenzaprine (FLEXERIL) 5 MG tablet Take 1 tablet (5 mg total) by mouth 3 (three) times daily as needed for muscle  spasms. Patient not taking: Reported on 05/25/2020 02/17/20   Ashley Murrain, NP    Allergies Aloe  Family History  Problem Relation Age of Onset  . Lymphoma Mother   . Hypertension Father   . Mental illness Father     Social History Social History   Tobacco Use  . Smoking status: Never Smoker  . Smokeless tobacco: Never Used  Substance Use Topics  . Alcohol use: No  . Drug use: No    Review of Systems Constitutional: No fever/chills.  No dizziness.  Eyes: No visual changes. Cardiovascular: Denies chest pain. 1 episode of heart racing as noted above.  Respiratory: Denies shortness of breath. Gastrointestinal: No abdominal pain.  No nausea, no vomiting.   Musculoskeletal: Negative for musculoskeletal Skin: Negative for rash. Neurological: Negative for headaches, focal weakness or numbness. ____________________________________________   PHYSICAL EXAM:  VITAL SIGNS: Constitutional: Alert and oriented. Well appearing and in no acute distress. Eyes: Conjunctivae are normal. PERRL. EOMI. Head: Atraumatic. Nose: No congestion/rhinnorhea. Neck: No stridor. Cardiovascular: Normal rate, regular rhythm. Grossly normal heart sounds.  Good peripheral circulation. Respiratory: Normal respiratory effort.  No retractions. Lungs CTAB. Gastrointestinal: Soft and nontender. No distention. No abdominal bruits. Musculoskeletal: Moves upper and lower extremities without any difficulty.  Normal gait was noted.  No tenderness is noted to the joint areas and no edema is noted to the lower extremities.  Patient is amatory without any assistance. Neurologic:  Normal speech and language. No gross focal neurologic deficits  are appreciated. No gait instability. Skin:  Skin is warm, dry and intact.  Mild scattered eczema/psoriasis type rash currently on the wrist and forearm area. Psychiatric: Mood and affect are normal. Speech and behavior are  normal.  ____________________________________________   LABS (all labs ordered are listed, but only abnormal results are displayed)  Labs were reviewed. ____________________________________________  EKG  Sinus bradycardia with a ventricular rate of 59. ____________________________________________  PROCEDURES  Procedure(s) performed (including Critical Care):  Procedures   ____________________________________________    FINAL CLINICAL IMPRESSION(S) Annual physical exam Eczema/psoriasis controlled by topical cream    ED Discharge Orders         Ordered    EKG 12-Lead        05/25/20 0803           Note:  This document was prepared using Dragon voice recognition software and may include unintentional dictation errors.

## 2020-05-28 ENCOUNTER — Other Ambulatory Visit: Payer: Self-pay

## 2020-05-28 DIAGNOSIS — L308 Other specified dermatitis: Secondary | ICD-10-CM

## 2020-05-28 DIAGNOSIS — J302 Other seasonal allergic rhinitis: Secondary | ICD-10-CM

## 2020-05-28 MED ORDER — CETIRIZINE HCL 10 MG PO TABS: 10.0000 mg | ORAL_TABLET | Freq: Every day | ORAL | 1 refills | Status: DC

## 2020-06-15 ENCOUNTER — Encounter: Payer: Self-pay | Admitting: Physician Assistant

## 2020-06-15 ENCOUNTER — Other Ambulatory Visit: Payer: Self-pay

## 2020-06-15 ENCOUNTER — Ambulatory Visit: Payer: Self-pay | Admitting: Physician Assistant

## 2020-06-15 VITALS — BP 120/81 | HR 74 | Temp 98.1°F | Resp 12

## 2020-06-15 DIAGNOSIS — J029 Acute pharyngitis, unspecified: Secondary | ICD-10-CM

## 2020-06-15 LAB — POCT RAPID STREP A (OFFICE): Rapid Strep A Screen: NEGATIVE

## 2020-06-15 MED ORDER — TRIAMCINOLONE ACETONIDE 40 MG/ML IJ SUSP
40.0000 mg | Freq: Once | INTRAMUSCULAR | Status: AC
  Administered 2020-06-15: 40 mg via INTRAMUSCULAR

## 2020-06-15 MED ORDER — LIDOCAINE VISCOUS HCL 2 % MT SOLN: 5.0000 mL | Freq: Four times a day (QID) | OROMUCOSAL | 0 refills | Status: DC | PRN

## 2020-06-15 MED ORDER — METHYLPREDNISOLONE 4 MG PO TBPK: ORAL_TABLET | ORAL | 0 refills | Status: DC

## 2020-06-15 MED ORDER — PSEUDOEPH-BROMPHEN-DM 30-2-10 MG/5ML PO SYRP: 5.0000 mL | ORAL_SOLUTION | Freq: Four times a day (QID) | ORAL | 0 refills | Status: DC | PRN

## 2020-06-15 NOTE — Addendum Note (Signed)
Addended by: Aliene Altes on: 06/15/2020 11:01 AM   Modules accepted: Orders

## 2020-06-15 NOTE — Progress Notes (Signed)
SSx started Friday Tried Zyrtec last week Diff sleeping last 2 night, bloated & gassy & stopped taking Zyrtec & it went away. Took some Mucinex No H/A & facail pain pressure

## 2020-06-15 NOTE — Progress Notes (Signed)
   Subjective: Sore throat    Patient ID: Brandon Welch, male    DOB: July 08, 1970, 50 y.o.   MRN: [id number]  HPI Patient complain of 3 days of sore throat.  Patient states pain with swallowing.  Patient denies fever/chills.  Patient denies recent travel or known contact with COVID-19.  Patient denies loss of taste or smell.  COVID-19 vaccines are up-to-date.   Review of Systems    Review of systems remarkable for seasonal rhinitis and hypogonadism. Objective:   Physical Exam No acute distress. BP is 120/81, pulse 74, respiration 20, temperature 98.1, and patient is 97% O2 sat on room air. HEENT is remarkable erythematous pharynx.  No exudate.  Neck is supple for appendectomy or bruits.  Lungs clear to auscultation.  Heart regular rate and rhythm. Rapid strep test was negative.       Assessment & Plan: Pharyngitis  Conservative treatment for complaint.  Patient given Kenalog 40 mg IM, viscous lidocaine for swish and swallow, and a prescription for Medrol Dosepak.  Advised follow-up in 3 days if no improvement or worsening complaint.

## 2020-07-21 ENCOUNTER — Telehealth: Payer: Self-pay

## 2020-07-21 ENCOUNTER — Ambulatory Visit: Payer: 59 | Admitting: Emergency Medicine

## 2020-07-21 NOTE — Telephone Encounter (Signed)
Brandon Welch called to report that on Saturday (07/18/20) he had an episode of HR at 200 bpm that lasted for approximately 30 minutes, then dropped down to 90's that lasted for 15-20 minutes & then dropped down to 80's.  By Sunday morning HR running in 60's.  Only other symptom was felt like couldn't breathe deep until the excelerated beats stopped. States he was gardening when this occurred. Said similar thing happened years ago when Dr. Burt Ek was at the clinic & Dr. Burt Ek put him on medication & referred him to Dr. Nehemiah Massed at [facility].  Said he only took the medication a few months & stopped because he wasn't having any other issues. Denies chest pain, dizziness, nausea, lightheadedness. Brandon Welch has an Apple watch & he captured two EKG's.  He forwarded them to the clinic.  Pulled paper chart in the clinic.  The episode he referenced was 12/092010 visit with Dr. Burt Ek.  There are notes from 04/14/09 visit with [facility] Cardiologist - Dr. Nehemiah Massed.  He had an Echocardiogram & Holter Monitor study.  Letitia Neri, PA-C [facility] Interim Provider) reviewed the paper chart notes, the Apple watch ECG's & the notes about [name]'s 07/18/20 event.  Since it's been over 10 years since he saw cardiology, she recommends cardiology work up and/or if he experiences again to go to ED for medical evaluation.  Informed [name] of Rhonda's recommendation.  Verbalized understanding.  Offered to iniate a referral to Dr. Nehemiah Massed.  Declined at this times.  States he will make an appointment if it happens again.  AMD

## 2020-08-27 ENCOUNTER — Other Ambulatory Visit: Payer: Self-pay

## 2020-08-27 DIAGNOSIS — E291 Testicular hypofunction: Secondary | ICD-10-CM

## 2020-08-27 MED ORDER — TESTOSTERONE 1.62 % TD GEL: TRANSDERMAL | 2 refills | Status: DC

## 2021-01-05 ENCOUNTER — Other Ambulatory Visit: Payer: Self-pay

## 2021-01-05 NOTE — Progress Notes (Signed)
[facility] specimen ID: [id number]

## 2021-01-18 DIAGNOSIS — H52223 Regular astigmatism, bilateral: Secondary | ICD-10-CM | POA: Diagnosis not present

## 2021-01-24 ENCOUNTER — Other Ambulatory Visit: Payer: Self-pay | Admitting: Adult Health

## 2021-01-24 DIAGNOSIS — E291 Testicular hypofunction: Secondary | ICD-10-CM

## 2021-02-05 ENCOUNTER — Other Ambulatory Visit: Payer: Self-pay

## 2021-02-05 DIAGNOSIS — E291 Testicular hypofunction: Secondary | ICD-10-CM

## 2021-02-05 MED ORDER — TESTOSTERONE 1.62 % TD GEL: TRANSDERMAL | 2 refills | Status: DC

## 2021-02-16 ENCOUNTER — Ambulatory Visit: Payer: Self-pay | Admitting: Physician Assistant

## 2021-02-16 ENCOUNTER — Encounter: Payer: Self-pay | Admitting: Physician Assistant

## 2021-02-16 DIAGNOSIS — U071 COVID-19: Secondary | ICD-10-CM

## 2021-02-16 MED ORDER — PSEUDOEPH-BROMPHEN-DM 30-2-10 MG/5ML PO SYRP: 5.0000 mL | ORAL_SOLUTION | Freq: Four times a day (QID) | ORAL | 0 refills | Status: DC | PRN

## 2021-02-16 MED ORDER — NIRMATRELVIR/RITONAVIR (PAXLOVID)TABLET: 3.0000 | ORAL_TABLET | Freq: Two times a day (BID) | ORAL | 0 refills | Status: AC

## 2021-02-16 MED ORDER — IBUPROFEN 800 MG PO TABS: 800.0000 mg | ORAL_TABLET | Freq: Three times a day (TID) | ORAL | 0 refills | Status: DC | PRN

## 2021-02-16 NOTE — Progress Notes (Signed)
   Subjective: COVID-19    Patient ID: Brandon Welch, male    DOB: 1970-07-05, 50 y.o.   MRN: [id number]  HPI Patient test positive COVID-19 last night.  Requesting medications for nasal congestion and cough.  Patient also complain of body aches.   Review of Systems Negative except for complaint    Objective:   Physical Exam  This was a virtual visit      Assessment & Plan: COVID-19   Patient given discharge care instruction advised take medications as directed.

## 2021-02-22 ENCOUNTER — Other Ambulatory Visit: Payer: Self-pay

## 2021-02-22 ENCOUNTER — Encounter: Payer: Self-pay | Admitting: Physician Assistant

## 2021-02-22 ENCOUNTER — Ambulatory Visit: Payer: Self-pay | Admitting: Physician Assistant

## 2021-02-22 VITALS — BP 124/81 | HR 80 | Temp 96.9°F | Resp 14 | Ht 74.0 in | Wt 220.0 lb

## 2021-02-22 DIAGNOSIS — H6501 Acute serous otitis media, right ear: Secondary | ICD-10-CM

## 2021-02-22 DIAGNOSIS — R0981 Nasal congestion: Secondary | ICD-10-CM

## 2021-02-22 MED ORDER — FEXOFENADINE-PSEUDOEPHED ER 60-120 MG PO TB12: 1.0000 | ORAL_TABLET | Freq: Two times a day (BID) | ORAL | 0 refills | Status: DC

## 2021-02-22 MED ORDER — AMOXICILLIN-POT CLAVULANATE 875-125 MG PO TABS: 1.0000 | ORAL_TABLET | Freq: Two times a day (BID) | ORAL | 0 refills | Status: DC

## 2021-02-22 NOTE — Progress Notes (Signed)
Loud ringing in Right ear accompanied with pain rated 2-3 on 0-10 pain scale. Day 8 of post COVID via rapid test at home. Ongoing thick green nasal discharge 4-5 x per day, no other symptoms at this time.

## 2021-02-22 NOTE — Progress Notes (Signed)
   Subjective: Right ear pain    Patient ID: Brandon Welch, male    DOB: 01/15/1971, 50 y.o.   MRN: [id number]  HPI Patient presents with right ear pain and "ringing" in the right ear.  Patient also complains of nasal congestion and green nasal discharge.  Patient was diagnosed with COVID 10 days ago.  Patient has a history of right eustachian tube dysfunction.  Patient state sense of taste and smell is gradually returning.  Denies fever, chills, body aches.   Review of Systems Seasonal rhinitis eustachian tube dysfunction.    Objective:   Physical Exam No acute distress.  Temperature 96.9, pulse 80, respiration 14, BP is 124/81, patient 98% O2 sat on room air.  Patient weighs 220 pounds and BMI is 28.25. HEENT is remarkable for erythematous and edematous right TM.  Left ear canal and TM is unremarkable.  Patient has bilateral maxillary guarding.  Greenish tinged nasal discharge.  Lungs clear to auscultation heart is regular rate and rhythm.      Assessment & Plan: Otitis media and nasal congestion.   Patient given prescription Augmentin and Allegra-D.  Patient advised to follow-up with 5 days if no improvement or worsening complaint.

## 2021-02-25 ENCOUNTER — Encounter: Payer: Self-pay | Admitting: Physician Assistant

## 2021-02-25 ENCOUNTER — Other Ambulatory Visit: Payer: Self-pay

## 2021-02-25 ENCOUNTER — Ambulatory Visit: Payer: Self-pay | Admitting: Physician Assistant

## 2021-02-25 VITALS — BP 127/84 | HR 94 | Temp 97.7°F | Resp 14 | Ht 74.0 in | Wt 215.0 lb

## 2021-02-25 DIAGNOSIS — H6501 Acute serous otitis media, right ear: Secondary | ICD-10-CM

## 2021-02-25 NOTE — Progress Notes (Signed)
   Subjective: Otitis media    Patient ID: Brandon Welch, male    DOB: 07-20-1970, 50 y.o.   MRN: [id number]  HPI Patient follow-up for otitis media.  Patient's been on antibiotics for 4 days.  Patient is also taking Allegra-D.  Patient stated the left ear has resolved complaint of pain and hearing loss.  Patient continues complain pain to the right ear with mild hearing loss.   Review of Systems Negative except for complaint    Objective:   Physical Exam No acute distress.  Temperature is 97.7, pulse 94, respiration 14, BP is 127/84, patient weighs 215 pounds and BMI is 27.6. HEENT reveals no acute findings examined of the left ear canal and TM.  Right canal is clear with mild edema to the right TM.       Assessment & Plan: Resolving otitis media   Patient advised continue treatment and follow-up in 4 days if no improvement or worsening complaint.

## 2021-03-01 ENCOUNTER — Ambulatory Visit: Payer: Self-pay | Admitting: Physician Assistant

## 2021-03-01 ENCOUNTER — Encounter: Payer: Self-pay | Admitting: Physician Assistant

## 2021-03-01 ENCOUNTER — Other Ambulatory Visit: Payer: Self-pay

## 2021-03-01 VITALS — BP 125/77 | HR 80 | Temp 98.0°F | Resp 14 | Ht 74.0 in | Wt 210.0 lb

## 2021-03-01 DIAGNOSIS — H9311 Tinnitus, right ear: Secondary | ICD-10-CM

## 2021-03-01 DIAGNOSIS — H9121 Sudden idiopathic hearing loss, right ear: Secondary | ICD-10-CM

## 2021-03-01 NOTE — Progress Notes (Signed)
Pt states he's still experiencing hearing loss and ringing in right ear.Burna Sis

## 2021-03-01 NOTE — Progress Notes (Signed)
   Subjective: Right ear pain, ringing in ear, and hearing loss    Patient ID: Brandon Welch, male    DOB: 02/20/1971, 50 y.o.   MRN: [id number]  HPI Patient follow-up for otitis media was diagnosed 02/22/2021.  Patient states increased right ear pain, increased tinnitus, and hearing loss.  Patient was placed on Augmentin and Allegra-D without any noticeable effects.  Patient stated ringing increased use of ear protection.  Denies vertigo.   Review of Systems Hypogonadism    Objective:   Physical Exam  No acute distress.  Temperature 98, pulse 80, respiration 14, BP is 125/76 and patient 97% O2 sat on room air.  Patient weighs 210 pounds and BMI is 27.96. HEENT is unremarkable.  Neck is supple without lymphadenopathy or bruits.  Lungs are clear to auscultation.  Heart regular rate and rhythm. Audiogram result shows right high-frequency hearing loss in comparison to baseline on file.      Assessment & Plan: Tinnitus and hearing loss   Consult to ENT and will follow-up after their evaluation.

## 2021-03-01 NOTE — Addendum Note (Signed)
Addended by: Aliene Altes on: 03/01/2021 02:55 PM   Modules accepted: Orders

## 2021-04-09 DIAGNOSIS — H903 Sensorineural hearing loss, bilateral: Secondary | ICD-10-CM | POA: Diagnosis not present

## 2021-04-16 DIAGNOSIS — H9121 Sudden idiopathic hearing loss, right ear: Secondary | ICD-10-CM | POA: Diagnosis not present

## 2021-04-16 DIAGNOSIS — H9311 Tinnitus, right ear: Secondary | ICD-10-CM | POA: Diagnosis not present

## 2021-04-19 ENCOUNTER — Other Ambulatory Visit: Payer: Self-pay | Admitting: Otolaryngology

## 2021-04-19 DIAGNOSIS — H9041 Sensorineural hearing loss, unilateral, right ear, with unrestricted hearing on the contralateral side: Secondary | ICD-10-CM

## 2021-04-26 ENCOUNTER — Other Ambulatory Visit: Payer: Self-pay

## 2021-04-26 ENCOUNTER — Ambulatory Visit: Payer: Self-pay

## 2021-04-26 DIAGNOSIS — Z0000 Encounter for general adult medical examination without abnormal findings: Secondary | ICD-10-CM

## 2021-04-26 LAB — POCT URINALYSIS DIPSTICK
Bilirubin, UA: NEGATIVE
Blood, UA: NEGATIVE
Glucose, UA: NEGATIVE
Ketones, UA: NEGATIVE
Leukocytes, UA: NEGATIVE
Nitrite, UA: NEGATIVE
Protein, UA: NEGATIVE
Spec Grav, UA: >=1.03 — AB (ref 1.010–1.025)
Urobilinogen, UA: 0.2 E.U./dL
pH, UA: 6 (ref 5.0–8.0)

## 2021-04-26 NOTE — Progress Notes (Signed)
05/03/21 annual physical scheduled.Brandon Welch

## 2021-04-27 LAB — CMP12+LP+TP+TSH+6AC+PSA+CBC…
ALT: 17 IU/L (ref 0–44)
AST: 41 IU/L — ABNORMAL HIGH (ref 0–40)
Albumin/Globulin Ratio: 2.1 (ref 1.2–2.2)
Albumin: 4.7 g/dL (ref 4.0–5.0)
Alkaline Phosphatase: 69 IU/L (ref 44–121)
BUN/Creatinine Ratio: 16 (ref 9–20)
BUN: 18 mg/dL (ref 6–24)
Basophils Absolute: 0.1 10*3/uL (ref 0.0–0.2)
Basos: 1 %
Bilirubin Total: 0.6 mg/dL (ref 0.0–1.2)
Calcium: 9.6 mg/dL (ref 8.7–10.2)
Chloride: 106 mmol/L (ref 96–106)
Chol/HDL Ratio: 3.6 ratio (ref 0.0–5.0)
Cholesterol, Total: 176 mg/dL (ref 100–199)
Creatinine, Ser: 1.14 mg/dL (ref 0.76–1.27)
EOS (ABSOLUTE): 0.1 10*3/uL (ref 0.0–0.4)
Eos: 2 %
Estimated CHD Risk: 0.6 times avg. (ref 0.0–1.0)
Free Thyroxine Index: 1.6 (ref 1.2–4.9)
GGT: 12 IU/L (ref 0–65)
Globulin, Total: 2.2 g/dL (ref 1.5–4.5)
Glucose: 90 mg/dL (ref 70–99)
HDL: 49 mg/dL (ref >39)
Hematocrit: 41.4 % (ref 37.5–51.0)
Hemoglobin: 14.5 g/dL (ref 13.0–17.7)
Immature Grans (Abs): 0 10*3/uL (ref 0.0–0.1)
Immature Granulocytes: 0 %
Iron: 94 ug/dL (ref 38–169)
LDH: 167 IU/L (ref 121–224)
LDL Chol Calc (NIH): 115 mg/dL — ABNORMAL HIGH (ref 0–99)
Lymphocytes Absolute: 1.5 10*3/uL (ref 0.7–3.1)
Lymphs: 30 %
MCH: 28.6 pg (ref 26.6–33.0)
MCHC: 35 g/dL (ref 31.5–35.7)
MCV: 82 fL (ref 79–97)
Monocytes Absolute: 0.5 10*3/uL (ref 0.1–0.9)
Monocytes: 10 %
Neutrophils Absolute: 2.9 10*3/uL (ref 1.4–7.0)
Neutrophils: 57 %
Phosphorus: 3.5 mg/dL (ref 2.8–4.1)
Platelets: 191 10*3/uL (ref 150–450)
Potassium: 4.5 mmol/L (ref 3.5–5.2)
Prostate Specific Ag, Serum: 1.4 ng/mL (ref 0.0–4.0)
RBC: 5.07 x10E6/uL (ref 4.14–5.80)
RDW: 12.7 % (ref 11.6–15.4)
Sodium: 143 mmol/L (ref 134–144)
T3 Uptake Ratio: 28 % (ref 24–39)
T4, Total: 5.7 ug/dL (ref 4.5–12.0)
TSH: 1.03 u[IU]/mL (ref 0.450–4.500)
Total Protein: 6.9 g/dL (ref 6.0–8.5)
Triglycerides: 63 mg/dL (ref 0–149)
Uric Acid: 4.7 mg/dL (ref 3.8–8.4)
VLDL Cholesterol Cal: 12 mg/dL (ref 5–40)
WBC: 5.2 10*3/uL (ref 3.4–10.8)
eGFR: 79 mL/min/{1.73_m2} (ref >59)

## 2021-04-29 ENCOUNTER — Ambulatory Visit
Admission: RE | Admit: 2021-04-29 | Discharge: 2021-04-29 | Disposition: A | Discharge status: Home / Self Care | Payer: 59 | Source: Ambulatory Visit | Attending: Otolaryngology | Admitting: Otolaryngology

## 2021-04-29 DIAGNOSIS — H9041 Sensorineural hearing loss, unilateral, right ear, with unrestricted hearing on the contralateral side: Secondary | ICD-10-CM | POA: Diagnosis not present

## 2021-04-29 MED ORDER — GADOBENATE DIMEGLUMINE 529 MG/ML IV SOLN
20.0000 mL | Freq: Once | INTRAVENOUS | Status: AC | PRN
  Administered 2021-04-29: 20 mL via INTRAVENOUS

## 2021-05-03 ENCOUNTER — Encounter: Payer: Self-pay | Admitting: Physician Assistant

## 2021-05-03 ENCOUNTER — Ambulatory Visit: Payer: Self-pay | Admitting: Physician Assistant

## 2021-05-03 ENCOUNTER — Other Ambulatory Visit: Payer: Self-pay

## 2021-05-03 VITALS — BP 124/77 | HR 85 | Temp 97.7°F | Resp 14 | Ht 74.0 in | Wt 210.0 lb

## 2021-05-03 DIAGNOSIS — Z0000 Encounter for general adult medical examination without abnormal findings: Secondary | ICD-10-CM

## 2021-05-03 NOTE — Progress Notes (Signed)
Pt concerned of MRI  results of right ear wants to discuss further options. Pt still having issues with heart racing randomly. Pt has a couple reading of pulse in his cell phone./CL,RMA

## 2021-05-03 NOTE — Progress Notes (Signed)
[facility]   ____________________________________________   None    (approximate)  I have reviewed the triage vital signs and the nursing notes.   HISTORY  Chief Complaint Annual Exam    HPI Brandon Welch is a 51 y.o. male patient presents for annual physical exam.  Patient wished to discuss findings of MRI of his head that was ordered by ENT clinic.         Past Medical History:  Diagnosis Date   Arthritis    Low testosterone     Patient Active Problem List   Diagnosis Date Noted   Hypogonadism in male 06/03/2019   Hearing loss due to cerumen impaction, right 11/29/2018   Acute upper respiratory infection 11/29/2018   Dysfunction of right eustachian tube 11/29/2018   History of repair of ACL 11/29/2018   Scrotal pain 09/01/2014    Past Surgical History:  Procedure Laterality Date   EYE SURGERY Left 1975   INGUINAL HERNIA REPAIR Left 1977   Inguinal Hernia Repair   KNEE ARTHROSCOPY WITH ANTERIOR CRUCIATE LIGAMENT (ACL) REPAIR WITH HAMSTRING GRAFT Right 01/15/2015   Procedure: KNEE ARTHROSCOPY WITH ANTERIOR CRUCIATE LIGAMENT (ACL) REPAIR WITH HAMSTRING AUTOGRAFT;  Surgeon: Thornton Park, MD;  Location: [facility] ORS;  Service: Orthopedics;  Laterality: Right;    Prior to Admission medications   Medication Sig Start Date End Date Taking? Authorizing Provider  Testosterone 1.62 % GEL APPLY 4 PUMPS TOPICALLY TO THE AFFECTED AREA EVERY MORNING 02/05/21  Yes Sable Feil, PA-C  triamcinolone (KENALOG) 0.1 % Apply topically 2 (two) times daily as needed. 05/06/20  Yes Cecil Cobbs, MD  triamcinolone cream (KENALOG) 0.5 % Apply 1 application topically 2 (two) times daily. Do not apply to facial areas 05/25/20  Yes Letitia Neri L, PA-C    Allergies Aloe  Family History  Problem Relation Age of Onset   Lymphoma Mother    Hypertension Father    Mental illness Father     Social History Social History   Tobacco Use   Smoking status: Never    Smokeless tobacco: Never  Substance Use Topics   Alcohol use: No   Drug use: No    Review of Systems Constitutional: No fever/chills Eyes: No visual changes. ENT: No sore throat.  Tinnitus Cardiovascular: Denies chest pain. Respiratory: Denies shortness of breath. Gastrointestinal: No abdominal pain.  No nausea, no vomiting.  No diarrhea.  No constipation. Genitourinary: Negative for dysuria.  Hypogonadism Musculoskeletal: Negative for back pain. Skin: Negative for rash. Neurological: Negative for headaches, focal weakness or numbness. Allergic/Immunilogical: Aloe ____________________________________________   PHYSICAL EXAM:  VITAL SIGNS: Temp is 97.7, pulse 85, respiration 14, BP is 124/77, patient 97% O2 sat on room air.  Patient weighs 210 pounds and BMI is 26.96. Constitutional: Alert and oriented. Well appearing and in no acute distress. Eyes: Conjunctivae are normal. PERRL. EOMI. Head: Atraumatic. Nose: No congestion/rhinnorhea. Mouth/Throat: Mucous membranes are moist.  Oropharynx non-erythematous. Neck: No stridor.  No cervical spine tenderness to palpation. Hematological/Lymphatic/Immunilogical: No cervical lymphadenopathy. Cardiovascular: Normal rate, regular rhythm. Grossly normal heart sounds.  Good peripheral circulation. Respiratory: Normal respiratory effort.  No retractions. Lungs CTAB. Gastrointestinal: Soft and nontender. No distention. No abdominal bruits. No CVA tenderness. Genitourinary: Deferred Musculoskeletal: No lower extremity tenderness nor edema.  No joint effusions. Neurologic:  Normal speech and language. No gross focal neurologic deficits are appreciated. No gait instability. Skin:  Skin is warm, dry and intact. No rash noted. Psychiatric: Mood and affect are normal.  Speech and behavior are normal.  ____________________________________________   LABS         Component Ref Range & Units 7 d ago (04/26/21) 11 mo ago (05/18/20) 2 yr  ago (04/01/19) 6 yr ago (01/13/15) 6 yr ago (01/13/15)  Glucose 70 - 99 mg/dL 90  92 R  93 R  97 R    Uric Acid 3.8 - 8.4 mg/dL 4.7  5.5 CM  5.0 CM     Comment:            Therapeutic target for gout patients: <6.0  BUN 6 - 24 mg/dL _0 High  R    Creatinine, Ser 0.76 - 1.27 mg/dL 1.14  1.09 CM  1.17  1.08 R    eGFR >59 mL/min/1.73 79       BUN/Creatinine Ratio 9 - _1 Sodium 134 - 144 mmol/L 143  146 High   140  141 R    Potassium 3.5 - 5.2 mmol/L 4.5  4.1  4.2  3.7 R    Chloride 96 - 106 mmol/L 106  102  104  105 R    Calcium 8.7 - 10.2 mg/dL 9.6  9.4  9.4  9.5 R    Phosphorus 2.8 - 4.1 mg/dL 3.5  3.1  3.5     Total Protein 6.0 - 8.5 g/dL 6.9  6.8  6.9     Albumin 4.0 - 5.0 g/dL 4.7  4.9  4.6     Globulin, Total 1.5 - 4.5 g/dL 2.2  1.9  2.3     Albumin/Globulin Ratio 1.2 - 2.2 2.1  2.6 High   2.0     Bilirubin Total 0.0 - 1.2 mg/dL 0.6  0.5  0.4     Alkaline Phosphatase 44 - 121 IU/L 69  75  82 R     LDH 121 - 224 IU/L 167  192  186     AST 0 - 40 IU/L 41 High   53 High   43 High      ALT 0 - 44 IU/L 17  32  20     GGT 0 - 65 IU/L _2 Iron 38 - 169 ug/dL 94  121  78     Cholesterol, Total 100 - 199 mg/dL 176  179  171     Triglycerides 0 - 149 mg/dL 63  99  81     HDL >39 mg/dL 49  48  50     VLDL Cholesterol Cal 5 - 40 mg/dL _3 LDL Chol Calc (NIH) 0 - 99 mg/dL 115 High   113 High   106 High      Chol/HDL Ratio 0.0 - 5.0 ratio 3.6  3.7 CM  3.4 CM     Comment:                                   T. Chol/HDL Ratio                                              Men  Women  1/2 Avg.Risk  3.4    3.3                                    Avg.Risk  5.0    4.4                                 2X Avg.Risk  9.6    7.1                                 3X Avg.Risk 23.4   11.0   Estimated CHD Risk 0.0 - 1.0 times avg. 0.6  0.6 CM  0.5 CM     Comment: The CHD Risk is based on the T. Chol/HDL ratio. Other  factors affect  CHD Risk such as hypertension, smoking,  diabetes, severe obesity, and family history of  premature CHD.   TSH 0.450 - 4.500 uIU/mL 1.030  1.620  1.570     T4, Total 4.5 - 12.0 ug/dL 5.7  6.4  5.5     T3 Uptake Ratio 24 - 39 % _0 Free Thyroxine Index 1.2 - 4.9 1.6  1.9  1.4     Prostate Specific Ag, Serum 0.0 - 4.0 ng/mL 1.4  1.1 CM  1.5 CM     Comment: Roche ECLIA methodology.  According to the American Urological Association, Serum PSA should  decrease and remain at undetectable levels after radical  prostatectomy. The AUA defines biochemical recurrence as an initial  PSA value 0.2 ng/mL or greater followed by a subsequent confirmatory  PSA value 0.2 ng/mL or greater.  Values obtained with different assay methods or kits cannot be used  interchangeably. Results cannot be interpreted as absolute evidence  of the presence or absence of malignant disease.   WBC 3.4 - 10.8 x10E3/uL 5.2  5.1  5.6   7.0 R   RBC 4.14 - 5.80 x10E6/uL 5.07  5.13  4.90   4.98 R   Hemoglobin 13.0 - 17.7 g/dL 14.5  14.3  13.7   14.2 R   Hematocrit 37.5 - 51.0 % 41.4  42.7  41.2   40.9 R   MCV 79 - 97 fL 82  83  84   82.2 R   MCH 26.6 - 33.0 pg 28.6  27.9  28.0   28.5 R   MCHC 31.5 - 35.7 g/dL 35.0  33.5  33.3   34.7 R   RDW 11.6 - 15.4 % 12.7  12.1  12.2   12.9 R   Platelets 150 - 450 x10E3/uL 191  219  192   188 R   Neutrophils Not Estab. % 57  56  62     Lymphs Not Estab. % _1 Monocytes Not Estab. % _2 Eos Not Estab. % _3 Basos Not Estab. % _4 Neutrophils Absolute 1.4 - 7.0 x10E3/uL 2.9  2.9  3.4     Lymphocytes Absolute 0.7 - 3.1 x10E3/uL 1.5  1.5  1.5     Monocytes Absolute 0.1 - 0.9 x10E3/uL 0.5  0.6  0.5  EOS (ABSOLUTE) 0.0 - 0.4 x10E3/uL 0.1  0.2  0.1     Basophils Absolute 0.0 - 0.2 x10E3/uL 0.1  0.1  0.1     Immature Granulocytes Not Estab. % 0  0  0     Immature Grans (Abs) 0.0 - 0.1 x10E3/uL 0.0  0.0  0.0     Resulting Agency  [facility]       Narrative Performed by: Maryan Puls Performed at:  [facility]  [address] [place], [place]  [id number]  Lab Director: Rush Farmer MD, Phone:  [phone]    Specimen Collected: 04/26/21 08:28 Last Resulted: 04/27/21 12:11      Lab Flowsheet    Order Details    View Encounter    Lab and Collection Details    Routing    Result History    View Encounter Conversation      CM=Additional comments  R=Reference range differs from displayed range      Result Care Coordination   Patient Communication   Add Comments   Seen Back to Top       Other Results from 04/26/2021   Contains abnormal data POCT urinalysis dipstick Order: [id number] Status: Final result    Visible to patient: Yes (seen)    Next appt: None    Dx: Routine adult health maintenance    0 Result Notes         Component Ref Range & Units 7 d ago 11 mo ago 1 yr ago 2 yr ago 6 yr ago  Color, UA  Amber  yellow  Light Yellow  Yellow VC    Clarity, UA  Clear  negaitve  Clear  Clear    Glucose, UA Negative Negative  Negative  Negative  Negative    Bilirubin, UA  Negative  negative  Negative  Negative    Ketones, UA  Negative  negative  Negative  Negative    Spec Grav, UA 1.010 - 1.025 >=1.030 Abnormal   1.020  1.025  >=1.030 Abnormal     Blood, UA  Negative  negative  Negative  Negative    pH, UA 5.0 - 8.0 6.0  6.0  6.0  5.5 VC    Protein, UA Negative Negative  Negative  Negative  Negative    Urobilinogen, UA 0.2 or 1.0 E.U./dL 0.2  0.2  0.2  0.2    Nitrite, UA  Negative  negative  Negative  Negative    Leukocytes, UA Negative Negative  Negative  Negative  Negative  NEGATIVE R   Appearance   negative      CLEAR Abnormal  R               ____________________________________________  EKG  Sinus  Rhythm  WITHIN NORMAL LIMITS at 62 bpm ____________________________________________   ____________________________________________   INITIAL  IMPRESSION / ASSESSMENT AND PLAN   As part of my medical decision making, I reviewed the following data within the electronic MEDICAL RECORD NUMBER   Discussed MRI and lab results with patient for acute findings.           ____________________________________________   FINAL CLINICAL IMPRESSION(S)  Well exam  ED Discharge Orders     None        Note:  This document was prepared using Dragon voice recognition software and may include unintentional dictation errors.

## 2021-06-01 DIAGNOSIS — H903 Sensorineural hearing loss, bilateral: Secondary | ICD-10-CM | POA: Diagnosis not present

## 2021-06-14 ENCOUNTER — Other Ambulatory Visit: Payer: Self-pay

## 2021-06-14 DIAGNOSIS — E291 Testicular hypofunction: Secondary | ICD-10-CM

## 2021-06-14 MED ORDER — TESTOSTERONE 1.62 % TD GEL: TRANSDERMAL | 2 refills | Status: DC

## 2021-07-22 ENCOUNTER — Other Ambulatory Visit: Payer: Self-pay

## 2021-07-22 NOTE — Progress Notes (Signed)
Pt completed random UDS &ETOH. ?Pending results for UDS with [facility].  ? ?

## 2021-08-11 ENCOUNTER — Telehealth: Payer: Self-pay

## 2021-08-11 ENCOUNTER — Other Ambulatory Visit: Payer: Self-pay | Admitting: Physician Assistant

## 2021-08-11 MED ORDER — SCOPOLAMINE 1 MG/3DAYS TD PT72: 1.0000 | MEDICATED_PATCH | TRANSDERMAL | 0 refills | Status: DC

## 2021-08-11 NOTE — Telephone Encounter (Signed)
Kemond called [facility] requesting something for upcoming deep see fishing trip to the [place].  States the weather forecast is for rough conditions & he's afraid [place]ather [place] may not be strong enough. ? ?Leaves next Monday & will be fishing on Tuesday & Wednesday  ? ?Sending request to Randel Pigg for Scopolamine patches. ? ?Pharmacy is [facility] in [place]. ? ?AMD ?

## 2021-10-12 ENCOUNTER — Other Ambulatory Visit: Payer: Self-pay

## 2021-10-12 DIAGNOSIS — E291 Testicular hypofunction: Secondary | ICD-10-CM

## 2021-10-12 MED ORDER — TESTOSTERONE 1.62 % TD GEL: TRANSDERMAL | 2 refills | Status: DC

## 2021-11-23 ENCOUNTER — Encounter: Payer: Self-pay | Admitting: Physician Assistant

## 2021-11-23 ENCOUNTER — Ambulatory Visit: Payer: Self-pay | Admitting: Physician Assistant

## 2021-11-23 VITALS — BP 132/79 | HR 55 | Temp 97.7°F | Resp 16 | Wt 220.0 lb

## 2021-11-23 DIAGNOSIS — K219 Gastro-esophageal reflux disease without esophagitis: Secondary | ICD-10-CM

## 2021-11-23 MED ORDER — ESOMEPRAZOLE MAGNESIUM 40 MG PO CPDR: 40.0000 mg | DELAYED_RELEASE_CAPSULE | Freq: Every day | ORAL | 2 refills | Status: DC

## 2021-11-23 NOTE — Progress Notes (Signed)
C/o upper GI bloating, burping and pressure in the chest and reflux symptoms x4 days increased.  Stated he tried a med for reflux x 1 and felt some relief.  Denies lower GI bloating.

## 2021-11-23 NOTE — Progress Notes (Signed)
   Subjective: GERD    Patient ID: Brandon Welch, male    DOB: 11/04/1970, 51 y.o.   MRN: [id number]  HPI Patient presents with 4 days of increased epigastric pain which she describes as "burning".  Patient states pain increases at night when laying down.  Patient states unable to get a restful night sleep secondary to this discomfort.  Denies diarrhea, nausea, or vomiting.  Review of Systems Hypogonadism    Objective:   Physical Exam  BP is 132/79, pulse 55, respirations 16, temperature 97.7, patient 97% O2 sat on room air.  Abdominal exam reveals negative HSM, normoactive bowel sounds, soft, nontender to palpation.      Assessment & Plan: Gastric reflux  Patient given a trial prescription of Nexium 40 mg and advised over-the-counter Benadryl at 50 mg.  Patient advised follow-up in 2 weeks.  Sooner if condition worsens.

## 2021-12-08 ENCOUNTER — Encounter: Payer: Self-pay | Admitting: Physician Assistant

## 2021-12-08 ENCOUNTER — Ambulatory Visit: Payer: Self-pay | Admitting: Physician Assistant

## 2021-12-08 VITALS — BP 117/72 | HR 82 | Temp 97.8°F | Resp 14 | Ht 74.0 in | Wt 220.0 lb

## 2021-12-08 DIAGNOSIS — R002 Palpitations: Secondary | ICD-10-CM

## 2021-12-08 DIAGNOSIS — H6983 Other specified disorders of Eustachian tube, bilateral: Secondary | ICD-10-CM

## 2021-12-08 MED ORDER — METHYLPREDNISOLONE 4 MG PO TBPK: ORAL_TABLET | ORAL | 0 refills | Status: DC

## 2021-12-08 MED ORDER — FEXOFENADINE-PSEUDOEPHED ER 60-120 MG PO TB12: 1.0000 | ORAL_TABLET | Freq: Two times a day (BID) | ORAL | 0 refills | Status: DC

## 2021-12-08 NOTE — Progress Notes (Signed)
Pt stared to feel pressure in right ear Sunday 12/05/21, worse in the mornings.

## 2021-12-08 NOTE — Progress Notes (Signed)
   Subjective: Bilateral air pressure and palpitations.    Patient ID: Brandon Welch, male    DOB: [birth year], 51 y.o.   MRN: [id number]  HPI Patient complains of 3 days of ear pressure without hearing loss.  Patient also complaining of sinus congestion.  Patient is using Allegra with no noticeable relief.  Denies vertigo.  History of eustachian tube dysfunction.  Patient also complaining of episodic palpitations for the past 2 months.  Patient's smart watch has recorded and alerted him of these episodes.  Patient denies provocative incident for complaints.  Denies shortness of breath or chest pain.  Patient states similar incident 7 years ago but work-up was unremarkable.  Requests referral back to cardiologist.   Review of Systems Hypogonadism    Objective:   Physical Exam  BP is 117/72, pulse 82, respiration 14, temp 97.8, and patient 97% O2 sat on room air.  Patient weighs 220 pounds and BMI is 28.25. HEENT is remarkable for bilateral edematous TM.  No edema or drainage.  Neck is supple without lymphadenopathy or bruits.  Lungs are clear to auscultation.  Heart is regular rate and rhythm.      Assessment & Plan: Eustachian tube dysfunction and palpitations.   Patient given discharge care instructions for station tube dysfunction.  Patient given prescription for Medrol Dosepak and Allegra-D.  Patient will follow-up with no improvement in 5 days.  Consult to cardiology was will be generated.

## 2021-12-08 NOTE — Addendum Note (Signed)
Addended by: Aliene Altes on: 12/08/2021 03:25 PM   Modules accepted: Orders

## 2021-12-16 DIAGNOSIS — R079 Chest pain, unspecified: Secondary | ICD-10-CM | POA: Diagnosis not present

## 2021-12-16 DIAGNOSIS — I471 Supraventricular tachycardia: Secondary | ICD-10-CM | POA: Diagnosis not present

## 2021-12-16 DIAGNOSIS — R002 Palpitations: Secondary | ICD-10-CM | POA: Diagnosis not present

## 2021-12-16 DIAGNOSIS — K219 Gastro-esophageal reflux disease without esophagitis: Secondary | ICD-10-CM | POA: Diagnosis not present

## 2021-12-20 DIAGNOSIS — R002 Palpitations: Secondary | ICD-10-CM | POA: Diagnosis not present

## 2021-12-24 DIAGNOSIS — R0602 Shortness of breath: Secondary | ICD-10-CM | POA: Diagnosis not present

## 2021-12-24 DIAGNOSIS — R079 Chest pain, unspecified: Secondary | ICD-10-CM | POA: Diagnosis not present

## 2021-12-27 DIAGNOSIS — R002 Palpitations: Secondary | ICD-10-CM | POA: Diagnosis not present

## 2021-12-27 DIAGNOSIS — K219 Gastro-esophageal reflux disease without esophagitis: Secondary | ICD-10-CM | POA: Diagnosis not present

## 2021-12-27 DIAGNOSIS — I471 Supraventricular tachycardia, unspecified: Secondary | ICD-10-CM | POA: Diagnosis not present

## 2021-12-27 DIAGNOSIS — R9431 Abnormal electrocardiogram [ECG] [EKG]: Secondary | ICD-10-CM | POA: Diagnosis not present

## 2022-01-03 DIAGNOSIS — R002 Palpitations: Secondary | ICD-10-CM | POA: Diagnosis not present

## 2022-01-04 DIAGNOSIS — R002 Palpitations: Secondary | ICD-10-CM | POA: Diagnosis not present

## 2022-01-05 ENCOUNTER — Ambulatory Visit: Payer: Self-pay

## 2022-01-05 VITALS — BP 121/74 | HR 56

## 2022-01-05 DIAGNOSIS — Z0130 Encounter for examination of blood pressure without abnormal findings: Secondary | ICD-10-CM

## 2022-01-05 NOTE — Progress Notes (Signed)
Pt presents today for BP check due to being put on 3 different medications for high heart reate. Pt states he was on a heart monitor.

## 2022-01-24 DIAGNOSIS — K219 Gastro-esophageal reflux disease without esophagitis: Secondary | ICD-10-CM | POA: Diagnosis not present

## 2022-01-24 DIAGNOSIS — E669 Obesity, unspecified: Secondary | ICD-10-CM | POA: Diagnosis not present

## 2022-01-24 DIAGNOSIS — R9430 Abnormal result of cardiovascular function study, unspecified: Secondary | ICD-10-CM | POA: Diagnosis not present

## 2022-01-24 DIAGNOSIS — I471 Supraventricular tachycardia, unspecified: Secondary | ICD-10-CM | POA: Diagnosis not present

## 2022-01-24 DIAGNOSIS — R002 Palpitations: Secondary | ICD-10-CM | POA: Diagnosis not present

## 2022-01-25 ENCOUNTER — Ambulatory Visit: Payer: Self-pay | Admitting: Physician Assistant

## 2022-01-25 ENCOUNTER — Encounter: Payer: Self-pay | Admitting: Physician Assistant

## 2022-01-25 VITALS — BP 120/71 | HR 64 | Temp 97.3°F | Resp 14 | Ht 72.0 in | Wt 225.0 lb

## 2022-01-25 DIAGNOSIS — R9430 Abnormal result of cardiovascular function study, unspecified: Secondary | ICD-10-CM | POA: Diagnosis not present

## 2022-01-25 DIAGNOSIS — R002 Palpitations: Secondary | ICD-10-CM | POA: Diagnosis not present

## 2022-01-25 DIAGNOSIS — E669 Obesity, unspecified: Secondary | ICD-10-CM | POA: Diagnosis not present

## 2022-01-25 DIAGNOSIS — K219 Gastro-esophageal reflux disease without esophagitis: Secondary | ICD-10-CM | POA: Diagnosis not present

## 2022-01-25 DIAGNOSIS — I471 Supraventricular tachycardia, unspecified: Secondary | ICD-10-CM | POA: Diagnosis not present

## 2022-01-25 DIAGNOSIS — Z0000 Encounter for general adult medical examination without abnormal findings: Secondary | ICD-10-CM

## 2022-01-25 NOTE — Progress Notes (Signed)
[facility]  ____________________________________________   None    (approximate)  I have reviewed the triage vital signs and the nursing notes.   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  Patient voiced no concerns or complaints.         Past Medical History:  Diagnosis Date   Arthritis    Low testosterone     Patient Active Problem List   Diagnosis Date Noted   Hypogonadism in male 06/03/2019   Hearing loss due to cerumen impaction, right 11/29/2018   Acute upper respiratory infection 11/29/2018   Dysfunction of right eustachian tube 11/29/2018   History of repair of ACL 11/29/2018   Scrotal pain 09/01/2014    Past Surgical History:  Procedure Laterality Date   EYE SURGERY Left 1975   INGUINAL HERNIA REPAIR Left 1977   Inguinal Hernia Repair   KNEE ARTHROSCOPY WITH ANTERIOR CRUCIATE LIGAMENT (ACL) REPAIR WITH HAMSTRING GRAFT Right 01/15/2015   Procedure: KNEE ARTHROSCOPY WITH ANTERIOR CRUCIATE LIGAMENT (ACL) REPAIR WITH HAMSTRING AUTOGRAFT;  Surgeon: Thornton Park, MD;  Location: [facility] ORS;  Service: Orthopedics;  Laterality: Right;    Prior to Admission medications   Medication Sig Start Date End Date Taking? Authorizing Provider  esomeprazole (NEXIUM) 40 MG capsule Take 1 capsule (40 mg total) by mouth daily at 12 noon. 11/23/21  Yes Sable Feil, PA-C  Testosterone 1.62 % GEL APPLY 4 PUMPS TOPICALLY TO THE AFFECTED AREA EVERY MORNING 10/12/21  Yes Sable Feil, PA-C  flecainide (TAMBOCOR) 50 MG tablet Take 50 mg by mouth 2 (two) times daily. 01/24/22   [provider]  metoprolol succinate (TOPROL-XL) 25 MG 24 hr tablet Take 25 mg by mouth daily. 01/24/22   [provider]  scopolamine (TRANSDERM-SCOP) 1 MG/3DAYS Place 1 patch (1.5 mg total) onto the skin every 3 (three) days. Patient not taking:  Reported on 11/23/2021 08/11/21   Sable Feil, PA-C  sotalol (BETAPACE) 80 MG tablet Take 80 mg by mouth daily. 12/27/21   [provider]  triamcinolone (KENALOG) 0.1 % Apply topically 2 (two) times daily as needed. Patient not taking: Reported on 11/23/2021 05/06/20   Cecil Cobbs, MD  triamcinolone cream (KENALOG) 0.5 % Apply 1 application topically 2 (two) times daily. Do not apply to facial areas Patient not taking: Reported on 11/23/2021 05/25/20   Johnn Hai, PA-C    Allergies Aloe  Family History  Problem Relation Age of Onset   Lymphoma Mother    Hypertension Father    Mental illness Father     Social History Social History   Tobacco Use   Smoking status: Never   Smokeless tobacco: Never  Substance Use Topics   Alcohol use: No   Drug use: No    Review of Systems Eyes: No visual changes. ENT: No sore throat. Cardiovascular: Denies chest pain. Respiratory: Denies shortness of breath. Gastrointestinal: No abdominal pain.  No nausea, no vomiting.  No diarrhea.  No constipation. Genitourinary: Negative for dysuria.  Hypogonadism Musculoskeletal: Negative for back pain. Skin: Negative for rash. Neurological: Negative for headaches, focal weakness or numbness. Allergic/Immunilogical: Aloe ____________________________________________   PHYSICAL EXAM:  VITAL SIGNS: BP is 120/70, pulse 64, respiration 14, temperature 97.3, patient 96% O2 sat on room air.  Patient weighs 225 pounds BMI is 30.52. Constitutional: Alert and oriented. Well appearing and in no acute distress. Eyes:  Conjunctivae are normal. PERRL. EOMI. Head: Atraumatic. Nose: No congestion/rhinnorhea. Mouth/Throat: Mucous membranes are moist.  Oropharynx non-erythematous. Neck: No stridor.  No cervical spine tenderness to palpation. Hematological/Lymphatic/Immunilogical: No cervical lymphadenopathy. Cardiovascular: Normal rate, regular rhythm. Grossly normal heart sounds.  Good peripheral  circulation. Respiratory: Normal respiratory effort.  No retractions. Lungs CTAB. Gastrointestinal: Soft and nontender. No distention. No abdominal bruits. No CVA tenderness. Genitourinary: Deferred Musculoskeletal: No lower extremity tenderness nor edema.  No joint effusions. Neurologic:  Normal speech and language. No gross focal neurologic deficits are appreciated. No gait instability. Skin:  Skin is warm, dry and intact. No rash noted. Psychiatric: Mood and affect are normal. Speech and behavior are normal.  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   ED Discharge Orders     None        Note:  This document was prepared using Dragon voice recognition software and may include unintentional dictation errors.

## 2022-01-25 NOTE — Progress Notes (Signed)
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

## 2022-02-09 DIAGNOSIS — H524 Presbyopia: Secondary | ICD-10-CM | POA: Diagnosis not present

## 2022-02-21 ENCOUNTER — Other Ambulatory Visit: Payer: Self-pay

## 2022-02-21 DIAGNOSIS — K219 Gastro-esophageal reflux disease without esophagitis: Secondary | ICD-10-CM

## 2022-02-21 MED ORDER — ESOMEPRAZOLE MAGNESIUM 40 MG PO CPDR: 40.0000 mg | DELAYED_RELEASE_CAPSULE | Freq: Every day | ORAL | 3 refills | Status: DC

## 2022-02-24 DIAGNOSIS — I471 Supraventricular tachycardia, unspecified: Secondary | ICD-10-CM | POA: Diagnosis not present

## 2022-02-24 DIAGNOSIS — R002 Palpitations: Secondary | ICD-10-CM | POA: Diagnosis not present

## 2022-02-24 DIAGNOSIS — K219 Gastro-esophageal reflux disease without esophagitis: Secondary | ICD-10-CM | POA: Diagnosis not present

## 2022-02-24 DIAGNOSIS — R9431 Abnormal electrocardiogram [ECG] [EKG]: Secondary | ICD-10-CM | POA: Diagnosis not present

## 2022-03-14 ENCOUNTER — Other Ambulatory Visit: Payer: Self-pay

## 2022-03-14 DIAGNOSIS — E291 Testicular hypofunction: Secondary | ICD-10-CM

## 2022-03-14 MED ORDER — TESTOSTERONE 1.62 % TD GEL: TRANSDERMAL | 2 refills | Status: DC

## 2022-04-08 DIAGNOSIS — J0100 Acute maxillary sinusitis, unspecified: Secondary | ICD-10-CM | POA: Diagnosis not present

## 2022-05-09 ENCOUNTER — Ambulatory Visit: Payer: Self-pay

## 2022-05-09 DIAGNOSIS — Z0000 Encounter for general adult medical examination without abnormal findings: Secondary | ICD-10-CM

## 2022-05-09 LAB — POCT URINALYSIS DIPSTICK
Bilirubin, UA: NEGATIVE
Blood, UA: NEGATIVE
Glucose, UA: NEGATIVE
Ketones, UA: NEGATIVE
Leukocytes, UA: NEGATIVE
Nitrite, UA: NEGATIVE
Protein, UA: NEGATIVE
Spec Grav, UA: >=1.03 — AB (ref 1.010–1.025)
Urobilinogen, UA: 0.2 E.U./dL
pH, UA: 5.5 (ref 5.0–8.0)

## 2022-05-09 NOTE — Progress Notes (Signed)
Pt presents to complete lab portion of physical./CL,RMA

## 2022-05-10 LAB — CMP12+LP+TP+TSH+6AC+PSA+CBC…
ALT: 19 IU/L (ref 0–44)
AST: 37 IU/L (ref 0–40)
Albumin/Globulin Ratio: 2 (ref 1.2–2.2)
Albumin: 4.7 g/dL (ref 4.1–5.1)
Alkaline Phosphatase: 83 IU/L (ref 44–121)
BUN/Creatinine Ratio: 13 (ref 9–20)
BUN: 15 mg/dL (ref 6–24)
Basophils Absolute: 0 10*3/uL (ref 0.0–0.2)
Basos: 1 %
Bilirubin Total: 0.5 mg/dL (ref 0.0–1.2)
Calcium: 9.4 mg/dL (ref 8.7–10.2)
Chloride: 104 mmol/L (ref 96–106)
Chol/HDL Ratio: 3.8 ratio (ref 0.0–5.0)
Cholesterol, Total: 191 mg/dL (ref 100–199)
Creatinine, Ser: 1.18 mg/dL (ref 0.76–1.27)
EOS (ABSOLUTE): 0.3 10*3/uL (ref 0.0–0.4)
Eos: 6 %
Estimated CHD Risk: 0.7 times avg. (ref 0.0–1.0)
Free Thyroxine Index: 2.1 (ref 1.2–4.9)
GGT: 12 IU/L (ref 0–65)
Globulin, Total: 2.4 g/dL (ref 1.5–4.5)
Glucose: 98 mg/dL (ref 70–99)
HDL: 50 mg/dL (ref >39)
Hematocrit: 44 % (ref 37.5–51.0)
Hemoglobin: 15 g/dL (ref 13.0–17.7)
Immature Grans (Abs): 0 10*3/uL (ref 0.0–0.1)
Immature Granulocytes: 0 %
Iron: 79 ug/dL (ref 38–169)
LDH: 184 IU/L (ref 121–224)
LDL Chol Calc (NIH): 125 mg/dL — ABNORMAL HIGH (ref 0–99)
Lymphocytes Absolute: 1.5 10*3/uL (ref 0.7–3.1)
Lymphs: 31 %
MCH: 29 pg (ref 26.6–33.0)
MCHC: 34.1 g/dL (ref 31.5–35.7)
MCV: 85 fL (ref 79–97)
Monocytes Absolute: 0.4 10*3/uL (ref 0.1–0.9)
Monocytes: 9 %
Neutrophils Absolute: 2.7 10*3/uL (ref 1.4–7.0)
Neutrophils: 53 %
Phosphorus: 3.6 mg/dL (ref 2.8–4.1)
Platelets: 210 10*3/uL (ref 150–450)
Potassium: 4.1 mmol/L (ref 3.5–5.2)
Prostate Specific Ag, Serum: 1 ng/mL (ref 0.0–4.0)
RBC: 5.18 x10E6/uL (ref 4.14–5.80)
RDW: 12.5 % (ref 11.6–15.4)
Sodium: 141 mmol/L (ref 134–144)
T3 Uptake Ratio: 32 % (ref 24–39)
T4, Total: 6.7 ug/dL (ref 4.5–12.0)
TSH: 1.64 u[IU]/mL (ref 0.450–4.500)
Total Protein: 7.1 g/dL (ref 6.0–8.5)
Triglycerides: 90 mg/dL (ref 0–149)
Uric Acid: 5.1 mg/dL (ref 3.8–8.4)
VLDL Cholesterol Cal: 16 mg/dL (ref 5–40)
WBC: 5.1 10*3/uL (ref 3.4–10.8)
eGFR: 75 mL/min/{1.73_m2} (ref >59)

## 2022-05-12 ENCOUNTER — Ambulatory Visit: Payer: Self-pay | Admitting: Physician Assistant

## 2022-05-12 ENCOUNTER — Encounter: Payer: Self-pay | Admitting: Physician Assistant

## 2022-05-12 VITALS — BP 104/62 | HR 73 | Temp 98.2°F | Resp 16 | Wt 219.0 lb

## 2022-05-12 DIAGNOSIS — Z0000 Encounter for general adult medical examination without abnormal findings: Secondary | ICD-10-CM

## 2022-05-12 NOTE — Progress Notes (Signed)
[facility]  ____________________________________________   None    (approximate)  I have reviewed the triage vital signs and the nursing notes.   HISTORY  Chief Complaint No chief complaint on file.    HPI Brandon Welch is a 52 y.o. male          Past Medical History:  Diagnosis Date   Arthritis    Low testosterone     Patient Active Problem List   Diagnosis Date Noted   Hypogonadism in male 06/03/2019   Hearing loss due to cerumen impaction, right 11/29/2018   Acute upper respiratory infection 11/29/2018   Dysfunction of right eustachian tube 11/29/2018   History of repair of ACL 11/29/2018   Scrotal pain 09/01/2014    Past Surgical History:  Procedure Laterality Date   EYE SURGERY Left 1975   INGUINAL HERNIA REPAIR Left 1977   Inguinal Hernia Repair   KNEE ARTHROSCOPY WITH ANTERIOR CRUCIATE LIGAMENT (ACL) REPAIR WITH HAMSTRING GRAFT Right 01/15/2015   Procedure: KNEE ARTHROSCOPY WITH ANTERIOR CRUCIATE LIGAMENT (ACL) REPAIR WITH HAMSTRING AUTOGRAFT;  Surgeon: Thornton Park, MD;  Location: [facility] ORS;  Service: Orthopedics;  Laterality: Right;    Prior to Admission medications   Medication Sig Start Date End Date Taking? Authorizing Provider  Multiple Vitamins-Minerals (CENTRUM SILVER 50+MEN PO) Take 1 tablet by mouth once.   Yes [provider]  sotalol (BETAPACE) 80 MG tablet Take 80 mg by mouth daily. 12/27/21  Yes [provider]  Testosterone 1.62 % GEL APPLY 4 PUMPS TOPICALLY TO THE AFFECTED AREA EVERY MORNING 03/14/22  Yes Sable Feil, PA-C  esomeprazole (NEXIUM) 40 MG capsule Take 1 capsule (40 mg total) by mouth daily at 12 noon. Patient not taking: Reported on 05/12/2022 02/21/22   Sable Feil, PA-C  triamcinolone (KENALOG) 0.1 % Apply topically 2 (two) times daily as needed. Patient not taking: Reported on 11/23/2021 05/06/20   Cecil Cobbs, MD  triamcinolone cream (KENALOG) 0.5 % Apply  1 application topically 2 (two) times daily. Do not apply to facial areas Patient not taking: Reported on 11/23/2021 05/25/20   Johnn Hai, PA-C    Allergies Aloe and Aloe vera concentrate  Family History  Problem Relation Age of Onset   Lymphoma Mother    Hypertension Father    Mental illness Father     Social History Social History   Tobacco Use   Smoking status: Never   Smokeless tobacco: Never  Substance Use Topics   Alcohol use: No   Drug use: No    Review of Systems Constitutional: No fever/chills Eyes: No visual changes. ENT: No sore throat. Cardiovascular: Denies chest pain. Respiratory: Denies shortness of breath. Gastrointestinal: No abdominal pain.  No nausea, no vomiting.  No diarrhea.  No constipation. Genitourinary: Negative for dysuria.  Hypogonadism Musculoskeletal: Negative for back pain. Skin: Negative for rash. Neurological: Negative for headaches, focal weakness or numbness. Allergic/Immunilogical: Aloe ____________________________________________   PHYSICAL EXAM:  VITAL SIGNS: BP is 104/62, pulse 73, respirations 16, temperature 98.2, patient is 96% O2 sat on room air.  Patient weighs 2019 pounds BMI is 29.70. Constitutional: Alert and oriented. Well appearing and in no acute distress. Eyes: Conjunctivae are normal. PERRL. EOMI. Head: Atraumatic. Nose: No congestion/rhinnorhea. Mouth/Throat: Mucous membranes are moist.  Oropharynx non-erythematous. Neck: No stridor.  No cervical spine tenderness to palpation. Hematological/Lymphatic/Immunilogical: No cervical lymphadenopathy. Cardiovascular: Normal rate, regular rhythm. Grossly normal heart sounds.  Good peripheral circulation. Respiratory: Normal respiratory effort.  No retractions. Lungs CTAB. Gastrointestinal: Soft and nontender. No distention. No abdominal bruits. No CVA tenderness. Genitourinary: deferred Musculoskeletal: No lower extremity tenderness nor edema.  No joint  effusions. Neurologic:  Normal speech and language. No gross focal neurologic deficits are appreciated. No gait instability. Skin:  Skin is warm, dry and intact. No rash noted. Psychiatric: Mood and affect are normal. Speech and behavior are normal.  ____________________________________________   LABS           Component Ref Range & Units 3 d ago (05/09/22) 1 yr ago (04/26/21) 1 yr ago (05/18/20) 2 yr ago (02/17/20) 3 yr ago (04/01/19) 7 yr ago (01/13/15)  Color, UA  yellow Amber yellow Light Yellow Yellow VC   Clarity, UA  clear Clear negaitve Clear Clear   Glucose, UA Negative Negative Negative Negative Negative Negative   Bilirubin, UA  neg Negative negative Negative Negative   Ketones, UA  neg Negative negative Negative Negative   Spec Grav, UA 1.010 - 1.025 >=1.030 Abnormal  >=1.030 Abnormal  1.020 1.025 >=1.030 Abnormal    Blood, UA  neg Negative negative Negative Negative   pH, UA 5.0 - 8.0 5.5 6.0 6.0 6.0 5.5 VC   Protein, UA Negative Negative Negative Negative Negative Negative   Urobilinogen, UA 0.2 or 1.0 E.U./dL 0.2 0.2 0.2 0.2 0.2   Nitrite, UA  neg Negative negative Negative Negative   Leukocytes, UA Negative Negative Negative Negative Negative Negative NEGATIVE R  Appearance  medium  negative   CLEAR Abnormal  R  Odor         Resulting Agency       [facility]                 Component Ref Range & Units 3 d ago (05/09/22) 1 yr ago (04/26/21) 1 yr ago (05/18/20) 3 yr ago (04/01/19) 7 yr ago (01/13/15) 7 yr ago (01/13/15)  Glucose 70 - 99 mg/dL 98 90 92 R 93 R 97 R   Uric Acid 3.8 - 8.4 mg/dL 5.1 4.7 CM 5.5 CM 5.0 CM    Comment:            Therapeutic target for gout patients: <6.0  BUN 6 - 24 mg/dL 15 18 12 15 21 $ High  R   Creatinine, Ser 0.76 - 1.27 mg/dL 1.18 1.14 1.09 CM 1.17 1.08 R   eGFR >59 mL/min/1.73 75 79      BUN/Creatinine Ratio 9 - 20 13 16 11 13    $ Sodium 134 - 144 mmol/L 141 143 146 High  140 141 R   Potassium 3.5 - 5.2 mmol/L 4.1 4.5 4.1 4.2 3.7  R   Chloride 96 - 106 mmol/L 104 106 102 104 105 R   Calcium 8.7 - 10.2 mg/dL 9.4 9.6 9.4 9.4 9.5 R   Phosphorus 2.8 - 4.1 mg/dL 3.6 3.5 3.1 3.5    Total Protein 6.0 - 8.5 g/dL 7.1 6.9 6.8 6.9    Albumin 4.1 - 5.1 g/dL 4.7 4.7 R 4.9 R 4.6 R    Globulin, Total 1.5 - 4.5 g/dL 2.4 2.2 1.9 2.3    Albumin/Globulin Ratio 1.2 - 2.2 2.0 2.1 2.6 High  2.0    Bilirubin Total 0.0 - 1.2 mg/dL 0.5 0.6 0.5 0.4    Alkaline Phosphatase 44 - 121 IU/L 83 69 75 82 R    LDH 121 - 224 IU/L 184 167 192 186    AST 0 - 40 IU/L 37 41 High  53 High  43 High     ALT 0 - 44 IU/L 19 17 32 20    GGT 0 - 65 IU/L 12 12 12 11    $ Iron 38 - 169 ug/dL 79 94 121 78    Cholesterol, Total 100 - 199 mg/dL 191 176 179 171    Triglycerides 0 - 149 mg/dL 90 63 99 81    HDL >39 mg/dL 50 49 48 50    VLDL Cholesterol Cal 5 - 40 mg/dL 16 12 18 15    $ LDL Chol Calc (NIH) 0 - 99 mg/dL 125 High  115 High  113 High  106 High     Chol/HDL Ratio 0.0 - 5.0 ratio 3.8 3.6 CM 3.7 CM 3.4 CM    Comment:                                   T. Chol/HDL Ratio                                             Men  Women                               1/2 Avg.Risk  3.4    3.3                                   Avg.Risk  5.0    4.4                                2X Avg.Risk  9.6    7.1                                3X Avg.Risk 23.4   11.0  Estimated CHD Risk 0.0 - 1.0 times avg. 0.7 0.6 CM 0.6 CM 0.5 CM    Comment: The CHD Risk is based on the T. Chol/HDL ratio. Other factors affect CHD Risk such as hypertension, smoking, diabetes, severe obesity, and family history of premature CHD.  TSH 0.450 - 4.500 uIU/mL 1.640 1.030 1.620 1.570    T4, Total 4.5 - 12.0 ug/dL 6.7 5.7 6.4 5.5    T3 Uptake Ratio 24 - 39 % 32 28 29 26    $ Free Thyroxine Index 1.2 - 4.9 2.1 1.6 1.9 1.4    Prostate Specific Ag, Serum 0.0 - 4.0 ng/mL 1.0 1.4 CM 1.1 CM 1.5 CM    Comment: Roche ECLIA methodology. According to the American Urological Association, Serum PSA should decrease  and remain at undetectable levels after radical prostatectomy. The AUA defines biochemical recurrence as an initial PSA value 0.2 ng/mL or greater followed by a subsequent confirmatory PSA value 0.2 ng/mL or greater. Values obtained with different assay methods or kits cannot be used interchangeably. Results cannot be interpreted as absolute evidence of the presence or absence of malignant disease.  WBC 3.4 - 10.8 x10E3/uL 5.1 5.2 5.1 5.6  7.0 R  RBC 4.14 - 5.80 x10E6/uL 5.18 5.07 5.13 4.90  4.98 R  Hemoglobin 13.0 - 17.7 g/dL 15.0  14.5 14.3 13.7  14.2 R  Hematocrit 37.5 - 51.0 % 44.0 41.4 42.7 41.2  40.9 R  MCV 79 - 97 fL 85 82 83 84  82.2 R  MCH 26.6 - 33.0 pg 29.0 28.6 27.9 28.0  28.5 R  MCHC 31.5 - 35.7 g/dL 34.1 35.0 33.5 33.3  34.7 R  RDW 11.6 - 15.4 % 12.5 12.7 12.1 12.2  12.9 R  Platelets 150 - 450 x10E3/uL 210 191 219 192  188 R  Neutrophils Not Estab. % 53 57 56 62    Lymphs Not Estab. % 31 30 29 27    $ Monocytes Not Estab. % 9 10 11 8    $ Eos Not Estab. % 6 2 3 2    $ Basos Not Estab. % 1 1 1 1    $ Neutrophils Absolute 1.4 - 7.0 x10E3/uL 2.7 2.9 2.9 3.4    Lymphocytes Absolute 0.7 - 3.1 x10E3/uL 1.5 1.5 1.5 1.5    Monocytes Absolute 0.1 - 0.9 x10E3/uL 0.4 0.5 0.6 0.5    EOS (ABSOLUTE) 0.0 - 0.4 x10E3/uL 0.3 0.1 0.2 0.1    Basophils Absolute 0.0 - 0.2 x10E3/uL 0.0 0.1 0.1 0.1    Immature Granulocytes Not Estab. % 0 0 0 0                 ____________________________________________  EKG Normal sinus rhythm at 61 bpm  ____________________________________________    ____________________________________________   INITIAL IMPRESSION / ASSESSMENT AND PLAN As part of my medical decision making, I reviewed the following data within the electronic MEDICAL RECORD NUMBER       Discussed no acute findings on physical exam and labs.     ____________________________________________   FINAL CLINICAL IMPRESSION  Well exam ED Discharge Orders     None        Note:   This document was prepared using Dragon voice recognition software and may include unintentional dictation errors.

## 2022-05-12 NOTE — Progress Notes (Signed)
Here to see provider for yearly physical and no c/o voiced.

## 2022-05-27 ENCOUNTER — Ambulatory Visit: Payer: Managed Care, Other (non HMO) | Admitting: Cardiovascular Disease

## 2022-05-30 ENCOUNTER — Encounter: Payer: Self-pay | Admitting: Cardiovascular Disease

## 2022-05-30 ENCOUNTER — Ambulatory Visit (INDEPENDENT_AMBULATORY_CARE_PROVIDER_SITE_OTHER): Payer: Managed Care, Other (non HMO) | Admitting: Cardiovascular Disease

## 2022-05-30 VITALS — BP 102/62 | HR 54 | Ht 74.0 in | Wt 219.0 lb

## 2022-05-30 DIAGNOSIS — I471 Supraventricular tachycardia, unspecified: Secondary | ICD-10-CM | POA: Insufficient documentation

## 2022-05-30 MED ORDER — SOTALOL HCL 120 MG PO TABS: 120.0000 mg | ORAL_TABLET | Freq: Every day | ORAL | 2 refills | Status: DC

## 2022-05-30 NOTE — Progress Notes (Signed)
Cardiology Office Note   Date:  05/30/2022   ID:  Brandon Welch, DOB [birth year], MRN [id number]  PCP:  Pcp, No  Cardiologist:  Neoma Laming, MD      History of Present Illness: Brandon Welch is a 52 y.o. male who presents for  Chief Complaint  Patient presents with   Follow-up    3 month follow up    Patient in office for routine cardiac exam. Denies chest pain, shortness of breath, edema, palpitations. Patient complains of feeling fatigued.      Past Medical History:  Diagnosis Date   Arthritis    Low testosterone      Past Surgical History:  Procedure Laterality Date   EYE SURGERY Left 1975   INGUINAL HERNIA REPAIR Left 1977   Inguinal Hernia Repair   KNEE ARTHROSCOPY WITH ANTERIOR CRUCIATE LIGAMENT (ACL) REPAIR WITH HAMSTRING GRAFT Right 01/15/2015   Procedure: KNEE ARTHROSCOPY WITH ANTERIOR CRUCIATE LIGAMENT (ACL) REPAIR WITH HAMSTRING AUTOGRAFT;  Surgeon: Thornton Park, MD;  Location: [facility] ORS;  Service: Orthopedics;  Laterality: Right;     Current Outpatient Medications  Medication Sig Dispense Refill   Multiple Vitamins-Minerals (CENTRUM SILVER 50+MEN PO) Take 1 tablet by mouth once.     sotalol (BETAPACE) 120 MG tablet Take 1 tablet (120 mg total) by mouth daily. 30 tablet 2   Testosterone 1.62 % GEL APPLY 4 PUMPS TOPICALLY TO THE AFFECTED AREA EVERY MORNING 150 g 2   triamcinolone (KENALOG) 0.1 % Apply topically 2 (two) times daily as needed. 45 g 1   triamcinolone cream (KENALOG) 0.5 % Apply 1 application topically 2 (two) times daily. Do not apply to facial areas 30 g 2   No current facility-administered medications for this visit.    Allergies:   Aloe and Aloe vera concentrate    Social History:   reports that he has never smoked. He has never used smokeless tobacco. He reports that he does not drink alcohol and does not use drugs.   Family History:  family history includes Hypertension in his father; Lymphoma in his mother; Mental illness in  his father.    ROS:     Review of Systems  Constitutional: Negative.   HENT: Negative.    Eyes: Negative.   Respiratory: Negative.    Gastrointestinal: Negative.   Genitourinary: Negative.   Musculoskeletal: Negative.   Skin: Negative.   Neurological: Negative.   Endo/Heme/Allergies: Negative.   Psychiatric/Behavioral: Negative.    All other systems reviewed and are negative.    All other systems are reviewed and negative.    PHYSICAL EXAM: VS:  BP 102/62   Pulse (!) 54   Ht '6\' 2"'$  (1.88 m)   Wt 219 lb (99.3 kg)   SpO2 97%   BMI 28.12 kg/m  , BMI Body mass index is 28.12 kg/m. Last weight:  Wt Readings from Last 3 Encounters:  05/30/22 219 lb (99.3 kg)  05/12/22 219 lb (99.3 kg)  01/25/22 225 lb (102.1 kg)    Physical Exam Vitals reviewed.  Constitutional:      Appearance: Normal appearance. He is normal weight.  HENT:     Head: Normocephalic.     Nose: Nose normal.     Mouth/Throat:     Mouth: Mucous membranes are moist.  Eyes:     Pupils: Pupils are equal, round, and reactive to light.  Cardiovascular:     Rate and Rhythm: Normal rate and regular rhythm.     Pulses: Normal  pulses.     Heart sounds: Normal heart sounds.  Pulmonary:     Effort: Pulmonary effort is normal.  Abdominal:     General: Abdomen is flat. Bowel sounds are normal.  Musculoskeletal:        General: Normal range of motion.     Cervical back: Normal range of motion.  Skin:    General: Skin is warm.  Neurological:     General: No focal deficit present.     Mental Status: He is alert.  Psychiatric:        Mood and Affect: Mood normal.     EKG: sinus bradycardia, HR 52 bpm  Recent Labs: 05/09/2022: ALT 19; BUN 15; Creatinine, Ser 1.18; Hemoglobin 15.0; Platelets 210; Potassium 4.1; Sodium 141; TSH 1.640    Lipid Panel    Component Value Date/Time   CHOL 191 05/09/2022 0815   TRIG 90 05/09/2022 0815   HDL 50 05/09/2022 0815   CHOLHDL 3.8 05/09/2022 0815   LDLCALC 125 (H)  05/09/2022 0815      Other studies Reviewed: Patient: [id number] - Brandon Welch DOB:  [birth year]  Date:  01/24/2022 09:30 Provider: Neoma Laming MD Encounter: ALL ANGIOGRAMS (CTA BRAIN, CAROTIDS, RENAL ARTERIES, PE)   Page 1 REASON FOR VISIT  Referred by Dr.Natalin Bible Humphrey Rolls.    TESTS  Imaging: Computed Tomographic Angiography:  Cardiac multidetector CT was performed paying particular attention to the coronary arteries for the diagnosis of: Abnormal EKG. Diagnostic Drugs:  Administered iohexol (Omnipaque) through an antecubital vein and images from the examination were analyzed for the presence and extent of coronary artery disease, using 3D image processing software. 100 mL of non-ionic contrast (Omnipaque) was used.     TEST CONCLUSIONS  Quality of study: Excellent.  1-Calcium score: 0  2-Right dominant system.  3-Normal coronaries.   Neoma Laming MD  Electronically signed by: Neoma Laming     Date: 02/01/2022 11:50  Patient: [id number] - Brandon Welch DOB:  Apr 26, 1970  Date:  01/04/2022 10:30 Provider: Neoma Laming MD Encounter: ECHO   Page 2 REASON FOR VISIT  Visit for: Echocardiogram/R00.2  Sex:   Male         wt=225    lbs.  BP=126/74  Height= 73   inches.   TESTS  Imaging: Echocardiogram:  An echocardiogram in (2-d) mode was performed and in Doppler mode with color flow velocity mapping was performed. The aortic valve cusps are abnormal 2.0   cm, flow velocity .993   m/s, and systolic calculated mean flow gradient 3  mmHg. Mitral valve diastolic peak flow velocity E .632    m/s and E/A ratio 1.3. Aortic root diameter 3.5 cm. The LVOT internal diameter 3.1  cm and flow velocity was abnormal .842   m/s. LV systolic dimension [id number]   cm, diastolic 123456  cm, posterior wall thickness 1.15   cm, fractional shortening 55.8  %, and EF 86.5 %. IVS thickness 0.827   cm. LA dimension 3.6 cm. Mitral Valve has Trace Regurgitation. Pulmonic Valve has Trace  Regurgitation. Tricuspid Valve hasTrace Regurgitation.     ASSESSMENT  Technically adequate study.  Normal chamber sizes.  Normal left ventricular systolic function.  Normal right ventricular systolic function.  Normal right ventricular diastolic function.  Normal left ventricular wall motion.  Normal right ventricular wall motion.  Trace pulmonary regurgitation.  Trace tricuspid regurgitation.  Normal pulmonary artery pressure.  Trace mitral regurgitation.  No pericardial effusion.  Mild LVH.   THERAPY   Referring  physician: Dionisio David  Sonographer: Neoma Laming.   Neoma Laming MD  Electronically signed by: Neoma Laming     Date: 01/04/2022 13:27  Patient: [id number] - Brandon Welch DOB:  1970/06/22  Date:  12/24/2021 07:30 Provider: Neoma Laming MD Encounter: [facility] STRESS TEST   Page 2 TESTS                                                                                          [facility] [address] [place],  [address] [phone] STUDY:  Gated Stress / Rest Myocardial Perfusion Imaging Tomographic (SPECT) Including attenuation correction Wall Motion, Left Ventricular Ejection Fraction By Gated Technique.Treadmill Stress Test. SEX:  Male  WEIGHT: 220 lbs  HEIGHT: 74 in        ARMS UP: YES/NO                                                                        REFERRING PHYSICIAN: Dr.Williom Cedar Humphrey Rolls                                                                                                                                                                                                                       INDICATION FOR STUDY: CP  TECHNIQUE:  Approximately 20 minutes following the intravenous administration of 10.5 mCi of  Tc-38mSestamibi after stress testing in a reclined supine position with arms above their head if able to do so, gated SPECT imaging of the heart was performed. After about a 2hr break, the patient was injected intravenously with 33.7 mCi of Tc-928mestamibi.  Approximately 45 minutes later in the same position as stress imaging SPECT rest imaging of the heart was performed.  STRESS BY:  ShNeoma LamingMD PROTOCOL:   BrDarnell Level                                                                                     MAX PRED HR: 170                     85%: 145               75%: 128                                                                                                                   RESTING BP: 120/74  RESTING HR: 75 PEAK BP: 148/80  PEAK HR: 169 (99%)                                                                    EXERCISE DURATION:  10:00                                            METS: 11.7     REASON FOR TEST TERMINATION: Target reached/1 min post injection  SYMPTOMS: None  [facility] TREADMILL SCORE: 10                                     RISK: Low                                                                                                                                                                                                            EKG RESULTS: NSR. 90/min. No significant ST changes at peak exercise.                                                              IMAGE QUALITY: Good  PERFUSION/WALL MOTION FINDINGS: EF = 72%. Large severe reversible  basal and mid anteroseptal , inferoseptal wall defects, normal wall motion.                                                                           IMPRESSION:  Ischemia in the LAD territory with normal LVEF, advise CCTA.                                                                                                                                                                                                                                                                                        Neoma Laming, MD Stress Interpreting Physician / Nuclear Interpreting Physician  Neoma Laming MD  Electronically signed by: Neoma Laming     Date: 12/28/2021 11:15   ASSESSMENT AND PLAN:    ICD-10-CM   1. SVT (supraventricular tachycardia)  I47.10        Problem List Items Addressed This Visit       Cardiovascular and Mediastinum   SVT (supraventricular tachycardia) - Primary    Patient complaining of fatigue. Decrease sotalol to 120 mg once daily. Return in 2 weeks.       Relevant Medications   sotalol (BETAPACE) 120 MG tablet      Disposition:   Return in about 2 weeks (around 06/13/2022).    Total time spent: 30 minutes  Signed,  Neoma Laming, MD  05/30/2022 9:46 AM    [place]

## 2022-05-30 NOTE — Patient Instructions (Addendum)
Change to sotalol 120 mg once daily at night

## 2022-05-30 NOTE — Assessment & Plan Note (Signed)
Patient complaining of fatigue. Decrease sotalol to 120 mg once daily. Return in 2 weeks.

## 2022-06-08 ENCOUNTER — Other Ambulatory Visit: Payer: Self-pay

## 2022-06-08 NOTE — Progress Notes (Signed)
UDS completed and cleared for [facility].

## 2022-06-15 ENCOUNTER — Encounter: Payer: Self-pay | Admitting: Cardiovascular Disease

## 2022-06-20 ENCOUNTER — Encounter: Payer: Self-pay | Admitting: Cardiovascular Disease

## 2022-06-20 ENCOUNTER — Ambulatory Visit (INDEPENDENT_AMBULATORY_CARE_PROVIDER_SITE_OTHER): Payer: Managed Care, Other (non HMO) | Admitting: Cardiovascular Disease

## 2022-06-20 VITALS — BP 124/84 | HR 58 | Ht 74.0 in | Wt 223.6 lb

## 2022-06-20 DIAGNOSIS — I471 Supraventricular tachycardia, unspecified: Secondary | ICD-10-CM

## 2022-06-20 MED ORDER — SOTALOL HCL 120 MG PO TABS: 120.0000 mg | ORAL_TABLET | Freq: Every day | ORAL | 2 refills | Status: DC

## 2022-06-20 NOTE — Progress Notes (Signed)
Cardiology Office Note   Date:  06/20/2022   ID:  Brandon Welch, DOB 06-26-1970, MRN [id number]  PCP:  Pcp, No  Cardiologist:  Neoma Laming, MD      History of Present Illness: Brandon Welch is a 52 y.o. male who presents for  Chief Complaint  Patient presents with   Follow-up    2 week follow up    Patient in office for 2 week follow up. Reports fatigue improved minimally on decreased dose of sotalol. Did not have any episodes of racing heart rate.     Past Medical History:  Diagnosis Date   Arthritis    Low testosterone      Past Surgical History:  Procedure Laterality Date   EYE SURGERY Left 1975   INGUINAL HERNIA REPAIR Left 1977   Inguinal Hernia Repair   KNEE ARTHROSCOPY WITH ANTERIOR CRUCIATE LIGAMENT (ACL) REPAIR WITH HAMSTRING GRAFT Right 01/15/2015   Procedure: KNEE ARTHROSCOPY WITH ANTERIOR CRUCIATE LIGAMENT (ACL) REPAIR WITH HAMSTRING AUTOGRAFT;  Surgeon: Thornton Park, MD;  Location: [facility] ORS;  Service: Orthopedics;  Laterality: Right;     Current Outpatient Medications  Medication Sig Dispense Refill   loratadine (CLARITIN) 10 MG tablet Take 10 mg by mouth daily.     Multiple Vitamins-Minerals (CENTRUM SILVER 50+MEN PO) Take 1 tablet by mouth once.     Testosterone 1.62 % GEL APPLY 4 PUMPS TOPICALLY TO THE AFFECTED AREA EVERY MORNING 150 g 2   sotalol (BETAPACE) 120 MG tablet Take 1 tablet (120 mg total) by mouth daily. 30 tablet 2   No current facility-administered medications for this visit.    Allergies:   Aloe and Aloe vera concentrate    Social History:   reports that he has never smoked. He has never used smokeless tobacco. He reports that he does not drink alcohol and does not use drugs.   Family History:  family history includes Hypertension in his father; Lymphoma in his mother; Mental illness in his father.    ROS:     Review of Systems  Constitutional: Negative.   HENT: Negative.    Eyes: Negative.   Respiratory: Negative.     Cardiovascular: Negative.   Gastrointestinal: Negative.   Genitourinary: Negative.   Musculoskeletal: Negative.   Skin: Negative.   Neurological: Negative.   Endo/Heme/Allergies: Negative.   Psychiatric/Behavioral: Negative.    All other systems reviewed and are negative.   All other systems are reviewed and negative.   PHYSICAL EXAM: VS:  BP 124/84   Pulse (!) 58   Ht 6\' 2"  (1.88 m)   Wt 223 lb 9.6 oz (101.4 kg)   SpO2 97%   BMI 28.71 kg/m  , BMI Body mass index is 28.71 kg/m. Last weight:  Wt Readings from Last 3 Encounters:  06/20/22 223 lb 9.6 oz (101.4 kg)  05/30/22 219 lb (99.3 kg)  05/12/22 219 lb (99.3 kg)     Physical Exam Vitals reviewed.  Constitutional:      Appearance: Normal appearance. He is normal weight.  HENT:     Head: Normocephalic.     Nose: Nose normal.     Mouth/Throat:     Mouth: Mucous membranes are moist.  Eyes:     Pupils: Pupils are equal, round, and reactive to light.  Cardiovascular:     Rate and Rhythm: Normal rate and regular rhythm.     Pulses: Normal pulses.     Heart sounds: Normal heart sounds.  Pulmonary:  Effort: Pulmonary effort is normal.  Abdominal:     General: Abdomen is flat. Bowel sounds are normal.  Musculoskeletal:        General: Normal range of motion.     Cervical back: Normal range of motion.  Skin:    General: Skin is warm.  Neurological:     General: No focal deficit present.     Mental Status: He is alert.  Psychiatric:        Mood and Affect: Mood normal.     EKG:  none today  Recent Labs: 05/09/2022: ALT 19; BUN 15; Creatinine, Ser 1.18; Hemoglobin 15.0; Platelets 210; Potassium 4.1; Sodium 141; TSH 1.640    Lipid Panel    Component Value Date/Time   CHOL 191 05/09/2022 0815   TRIG 90 05/09/2022 0815   HDL 50 05/09/2022 0815   CHOLHDL 3.8 05/09/2022 0815   LDLCALC 125 (H) 05/09/2022 0815      Other studies Reviewed: none today   ASSESSMENT AND PLAN:    ICD-10-CM   1. SVT  (supraventricular tachycardia)  I47.10        Problem List Items Addressed This Visit       Cardiovascular and Mediastinum   SVT (supraventricular tachycardia) - Primary    Continue sotalol once daily to prevent recurrent SVT.      Relevant Medications   sotalol (BETAPACE) 120 MG tablet     Disposition:   Return in about 2 months (around 08/20/2022).    Total time spent: 30 minutes  Signed,  Neoma Laming, MD  06/20/2022 9:09 AM    [facility]

## 2022-06-20 NOTE — Assessment & Plan Note (Signed)
Continue sotalol once daily to prevent recurrent SVT.

## 2022-07-02 DIAGNOSIS — J309 Allergic rhinitis, unspecified: Secondary | ICD-10-CM | POA: Diagnosis not present

## 2022-07-04 ENCOUNTER — Ambulatory Visit: Payer: 59 | Admitting: Physician Assistant

## 2022-07-04 ENCOUNTER — Encounter: Payer: Self-pay | Admitting: Physician Assistant

## 2022-07-04 DIAGNOSIS — H9201 Otalgia, right ear: Secondary | ICD-10-CM

## 2022-07-04 MED ORDER — FEXOFENADINE-PSEUDOEPHED ER 60-120 MG PO TB12: 1.0000 | ORAL_TABLET | Freq: Two times a day (BID) | ORAL | 0 refills | Status: DC

## 2022-07-04 MED ORDER — METHYLPREDNISOLONE 4 MG PO TBPK: ORAL_TABLET | ORAL | 0 refills | Status: DC

## 2022-07-04 NOTE — Progress Notes (Signed)
Pt has felt pain and pressure in left ear x 2days.

## 2022-07-04 NOTE — Progress Notes (Signed)
   Subjective: Right ear pain    Patient ID: Brandon Welch, male    DOB: 03-27-1971, 52 y.o.   MRN: [id number]  HPI Patient presents for right ear pain that started 5d ago along with allergy symptoms that include itchy throat & sneezing. States he is concerned of not taking care of right ear pain due to having lost partial hearing of that ear last year due to similar reasons.    Review of Systems Reports right ear pain, sneezing, itchy throat. Denies fever, chills, malaise.     Objective:   Physical Exam Bilateral ear structures intact. Nasal & buccal mucosa intact. Lungs clear bilaterally.     Assessment & Plan: Right ear pain    Plan to take antihistamine/decongestant & course of corticosteroid. Will f/u in one week. Also instructed to obtain hearing test once medications are completed.

## 2022-07-11 ENCOUNTER — Encounter: Payer: Self-pay | Admitting: Physician Assistant

## 2022-07-11 ENCOUNTER — Ambulatory Visit: Payer: Self-pay | Admitting: Physician Assistant

## 2022-07-11 DIAGNOSIS — H9201 Otalgia, right ear: Secondary | ICD-10-CM

## 2022-07-11 NOTE — Progress Notes (Signed)
   Subjective: Hearing loss    Patient ID: Brandon Welch, male    DOB: 04/16/1970, 52 y.o.   MRN: [id number]  HPI Patient complains of 2 weeks of decreased hearing right greater than left.  Patient denies vertigo.  Patient was placed on combination decongestant antihistamine and steroids.  Patient stated remarkable improvement in the right ear.  Patient here today for audiogram   Review of Systems Negative except for chief complaint    Objective:   Physical Exam Vital signs deferred. Audiogram shows improved hearing from baseline of last year.      Assessment & Plan: Subjective hearing loss   Patient advised during his allergy season take Allegra-D.  Follow-up if condition worsens.

## 2022-07-29 ENCOUNTER — Other Ambulatory Visit: Payer: Self-pay

## 2022-07-29 DIAGNOSIS — E291 Testicular hypofunction: Secondary | ICD-10-CM

## 2022-07-29 MED ORDER — TESTOSTERONE 1.62 % TD GEL: TRANSDERMAL | 2 refills | Status: DC

## 2022-08-22 ENCOUNTER — Ambulatory Visit (INDEPENDENT_AMBULATORY_CARE_PROVIDER_SITE_OTHER): Payer: Managed Care, Other (non HMO) | Admitting: Cardiovascular Disease

## 2022-08-22 ENCOUNTER — Encounter: Payer: Self-pay | Admitting: Cardiovascular Disease

## 2022-08-22 VITALS — BP 124/82 | HR 72 | Ht 74.0 in | Wt 223.2 lb

## 2022-08-22 DIAGNOSIS — I340 Nonrheumatic mitral (valve) insufficiency: Secondary | ICD-10-CM | POA: Diagnosis not present

## 2022-08-22 DIAGNOSIS — Z8679 Personal history of other diseases of the circulatory system: Secondary | ICD-10-CM | POA: Diagnosis not present

## 2022-08-22 DIAGNOSIS — I471 Supraventricular tachycardia, unspecified: Secondary | ICD-10-CM | POA: Diagnosis not present

## 2022-08-22 NOTE — Progress Notes (Signed)
Cardiology Office Note   Date:  08/22/2022   ID:  Brandon Welch, DOB [birth year], MRN [id number]  PCP:  Pcp, No  Cardiologist:  Adrian Blackwater, MD      History of Present Illness: Brandon Welch is a 52 y.o. male who presents for  Chief Complaint  Patient presents with   Follow-up    2 month follow up    Had heart rate went up to 200 once after allegra  Palpitations  This is a chronic problem. The current episode started 1 to 4 weeks ago. The problem has been resolved.      Past Medical History:  Diagnosis Date   Arthritis    Low testosterone      Past Surgical History:  Procedure Laterality Date   EYE SURGERY Left 1975   INGUINAL HERNIA REPAIR Left 1977   Inguinal Hernia Repair   KNEE ARTHROSCOPY WITH ANTERIOR CRUCIATE LIGAMENT (ACL) REPAIR WITH HAMSTRING GRAFT Right 01/15/2015   Procedure: KNEE ARTHROSCOPY WITH ANTERIOR CRUCIATE LIGAMENT (ACL) REPAIR WITH HAMSTRING AUTOGRAFT;  Surgeon: Juanell Fairly, MD;  Location: [facility] OR[facility];  Service: Orthopedics;  Laterality: Right;     Current Outpatient Medications  Medication Sig Dispense Refill   fexofenadine (ALLEGRA) 180 MG tablet Take 180 mg by mouth daily.     Multiple Vitamins-Minerals (CENTRUM SILVER 50+MEN PO) Take 1 tablet by mouth once.     sotalol (BETAPACE) 120 MG tablet Take 1 tablet (120 mg total) by mouth daily. 30 tablet 2   Testosterone 1.62 % GEL APPLY 4 PUMPS TOPICALLY TO THE AFFECTED AREA EVERY MORNING 150 g 2   azelastine (ASTELIN) 0.1 % nasal spray Place 2 sprays into both nostrils 2 (two) times daily. (Patient not taking: Reported on 08/22/2022)     fexofenadine-pseudoephedrine (ALLEGRA-D) 60-120 MG 12 hr tablet Take 1 tablet by mouth 2 (two) times daily. (Patient not taking: Reported on 08/22/2022) 20 tablet 0   loratadine (CLARITIN) 10 MG tablet Take 10 mg by mouth daily. (Patient not taking: Reported on 08/22/2022)     methylPREDNISolone (MEDROL DOSEPAK) 4 MG TBPK tablet Take Tapered dose as directed  (Patient not taking: Reported on 08/22/2022) 21 tablet 0   No current facility-administered medications for this visit.    Allergies:   Aloe and Aloe vera concentrate    Social History:   reports that he has never smoked. He has never used smokeless tobacco. He reports that he does not drink alcohol and does not use drugs.   Family History:  family history includes Hypertension in his father; Lymphoma in his mother; Mental illness in his father.    ROS:     Review of Systems  Constitutional: Negative.   HENT: Negative.    Eyes: Negative.   Respiratory: Negative.    Cardiovascular:  Positive for palpitations.  Gastrointestinal: Negative.   Genitourinary: Negative.   Musculoskeletal: Negative.   Skin: Negative.   Neurological: Negative.   Endo/Heme/Allergies: Negative.   Psychiatric/Behavioral: Negative.    All other systems reviewed and are negative.     All other systems are reviewed and negative.    PHYSICAL EXAM: VS:  BP 124/82   Pulse 72   Ht 6\' 2"  (1.88 m)   Wt 223 lb 3.2 oz (101.2 kg)   SpO2 96%   BMI 28.66 kg/m  , BMI Body mass index is 28.66 kg/m. Last weight:  Wt Readings from Last 3 Encounters:  08/22/22 223 lb 3.2 oz (101.2 kg)  06/20/22 223 lb  9.6 oz (101.4 kg)  05/30/22 219 lb (99.3 kg)     Physical Exam Vitals reviewed.  Constitutional:      Appearance: Normal appearance. He is normal weight.  HENT:     Head: Normocephalic.     Nose: Nose normal.     Mouth/Throat:     Mouth: Mucous membranes are moist.  Eyes:     Pupils: Pupils are equal, round, and reactive to light.  Cardiovascular:     Rate and Rhythm: Normal rate and regular rhythm.     Pulses: Normal pulses.     Heart sounds: Normal heart sounds.  Pulmonary:     Effort: Pulmonary effort is normal.  Abdominal:     General: Abdomen is flat. Bowel sounds are normal.  Musculoskeletal:        General: Normal range of motion.     Cervical back: Normal range of motion.  Skin:     General: Skin is warm.  Neurological:     General: No focal deficit present.     Mental Status: He is alert.  Psychiatric:        Mood and Affect: Mood normal.       EKG:   Recent Labs: 05/09/2022: ALT 19; BUN 15; Creatinine, Ser 1.18; Hemoglobin 15.0; Platelets 210; Potassium 4.1; Sodium 141; TSH 1.640    Lipid Panel    Component Value Date/Time   CHOL 191 05/09/2022 0815   TRIG 90 05/09/2022 0815   HDL 50 05/09/2022 0815   CHOLHDL 3.8 05/09/2022 0815   LDLCALC 125 (H) 05/09/2022 0815      Other studies Reviewed: Additional studies/ records that were reviewed today include:  Review of the above records demonstrates:       No data to display            ASSESSMENT AND PLAN:  No diagnosis found.   Problem List Items Addressed This Visit   None      Disposition:   No follow-ups on file.    Total time spent: 30 minutes  Signed,  Adrian Blackwater, MD  08/22/2022 9:14 AM    [facility]

## 2022-09-03 ENCOUNTER — Other Ambulatory Visit: Payer: Self-pay | Admitting: Cardiovascular Disease

## 2022-11-14 ENCOUNTER — Encounter: Payer: Self-pay | Admitting: Physician Assistant

## 2022-11-14 ENCOUNTER — Ambulatory Visit: Payer: Self-pay | Admitting: Physician Assistant

## 2022-11-14 VITALS — BP 122/81 | HR 63 | Temp 98.1°F | Resp 14 | Wt 220.0 lb

## 2022-11-14 DIAGNOSIS — S39012A Strain of muscle, fascia and tendon of lower back, initial encounter: Secondary | ICD-10-CM

## 2022-11-14 MED ORDER — ORPHENADRINE CITRATE ER 100 MG PO TB12: 100.0000 mg | ORAL_TABLET | Freq: Two times a day (BID) | ORAL | 1 refills | Status: DC

## 2022-11-14 NOTE — Progress Notes (Signed)
   Subjective: Low back pain    Patient ID: Brandon Welch, male    DOB: 1970/09/03, 52 y.o.   MRN: [id number]  HPI Patient complain of low back pain for 2 days.  Incident occurring status post intense yard working to include weeding and cutting grass.  Denies radicular component to pain.  Denies bladder or bowel dysfunction.  Rates pain as a 4/5.  Described pain as "spasmatic".  Currently taking 200 mg ibuprofen.   Review of Systems Hypogonadism and SVT.    Objective:   Physical Exam BP 122/81  Pulse 63  Resp 14  Temp 98.1 F (36.7 C)  SpO2 97 %  Weight 220 lb (99.8 kg)   BMI 28.25 kg/m2  BSA 2.28 m2  No obvious lumbar spine deformity.  Patient has full and equal range of motion of the lumbar spine.  Patient has a left paraspinal muscle spasm with right lateral movements.  Patient has negative straight leg test in the sitting position.       Assessment & Plan: Lumbar strain

## 2022-11-14 NOTE — Progress Notes (Signed)
Stated low mid back pain has been in the past, but after weed eating it became more intense.  Level of pain low back at rest 4/5 and in motion it "catches" and pain increases.  Ambulated in without difficulty this day.  Stated taking only Ibuprofen at this time.

## 2022-11-21 ENCOUNTER — Encounter: Payer: Self-pay | Admitting: Cardiovascular Disease

## 2022-11-21 ENCOUNTER — Ambulatory Visit (INDEPENDENT_AMBULATORY_CARE_PROVIDER_SITE_OTHER): Payer: Managed Care, Other (non HMO) | Admitting: Cardiovascular Disease

## 2022-11-21 VITALS — BP 122/83 | HR 65 | Ht 74.0 in | Wt 227.2 lb

## 2022-11-21 DIAGNOSIS — R002 Palpitations: Secondary | ICD-10-CM

## 2022-11-21 DIAGNOSIS — Z8679 Personal history of other diseases of the circulatory system: Secondary | ICD-10-CM

## 2022-11-21 DIAGNOSIS — I340 Nonrheumatic mitral (valve) insufficiency: Secondary | ICD-10-CM

## 2022-11-21 DIAGNOSIS — R42 Dizziness and giddiness: Secondary | ICD-10-CM | POA: Diagnosis not present

## 2022-11-21 NOTE — Progress Notes (Signed)
Cardiology Office Note   Date:  11/21/2022   ID:  Brandon Welch, DOB 04-May-1970, MRN [id number]  PCP:  Pcp, No  Cardiologist:  Adrian Blackwater, MD      History of Present Illness: Brandon Welch is a 52 y.o. male who presents for  Chief Complaint  Patient presents with   Follow-up    Doing well,July 14 had afib 150/min      Past Medical History:  Diagnosis Date   Arthritis    Low testosterone      Past Surgical History:  Procedure Laterality Date   EYE SURGERY Left 1975   INGUINAL HERNIA REPAIR Left 1977   Inguinal Hernia Repair   KNEE ARTHROSCOPY WITH ANTERIOR CRUCIATE LIGAMENT (ACL) REPAIR WITH HAMSTRING GRAFT Right 01/15/2015   Procedure: KNEE ARTHROSCOPY WITH ANTERIOR CRUCIATE LIGAMENT (ACL) REPAIR WITH HAMSTRING AUTOGRAFT;  Surgeon: Juanell Fairly, MD;  Location: [facility];  Service: Orthopedics;  Laterality: Right;     Current Outpatient Medications  Medication Sig Dispense Refill   azelastine (ASTELIN) 0.1 % nasal spray Place 2 sprays into both nostrils 2 (two) times daily. (Patient not taking: Reported on 08/22/2022)     fexofenadine (ALLEGRA) 180 MG tablet Take 180 mg by mouth daily. (Patient not taking: Reported on 11/14/2022)     fexofenadine-pseudoephedrine (ALLEGRA-D) 60-120 MG 12 hr tablet Take 1 tablet by mouth 2 (two) times daily. (Patient not taking: Reported on 08/22/2022) 20 tablet 0   loratadine (CLARITIN) 10 MG tablet Take 10 mg by mouth daily. (Patient not taking: Reported on 08/22/2022)     Multiple Vitamins-Minerals (CENTRUM SILVER 50+MEN PO) Take 1 tablet by mouth once.     orphenadrine (NORFLEX) 100 MG tablet Take 1 tablet (100 mg total) by mouth 2 (two) times daily. 10 tablet 1   sotalol (BETAPACE) 120 MG tablet TAKE 1 TABLET(120 MG) BY MOUTH DAILY 30 tablet 2   Testosterone 1.62 % GEL APPLY 4 PUMPS TOPICALLY TO THE AFFECTED AREA EVERY MORNING 150 g 2   No current facility-administered medications for this visit.    Allergies:   Aloe and  Aloe vera concentrate    Social History:   reports that he has never smoked. He has never used smokeless tobacco. He reports that he does not drink alcohol and does not use drugs.   Family History:  family history includes Hypertension in his father; Lymphoma in his mother; Mental illness in his father.    ROS:     Review of Systems  Constitutional: Negative.   HENT: Negative.    Eyes: Negative.   Respiratory: Negative.    Gastrointestinal: Negative.   Genitourinary: Negative.   Musculoskeletal: Negative.   Skin: Negative.   Neurological: Negative.   Endo/Heme/Allergies: Negative.   Psychiatric/Behavioral: Negative.    All other systems reviewed and are negative.     All other systems are reviewed and negative.    PHYSICAL EXAM: VS:  BP 122/83   Pulse 65   Ht 6\' 2"  (1.88 m)   Wt 227 lb 3.2 oz (103.1 kg)   SpO2 99%   BMI 29.17 kg/m  , BMI Body mass index is 29.17 kg/m. Last weight:  Wt Readings from Last 3 Encounters:  11/21/22 227 lb 3.2 oz (103.1 kg)  11/14/22 220 lb (99.8 kg)  08/22/22 223 lb 3.2 oz (101.2 kg)     Physical Exam Vitals reviewed.  Constitutional:      Appearance: Normal appearance. He is normal weight.  HENT:  Head: Normocephalic.     Nose: Nose normal.     Mouth/Throat:     Mouth: Mucous membranes are moist.  Eyes:     Pupils: Pupils are equal, round, and reactive to light.  Cardiovascular:     Rate and Rhythm: Normal rate and regular rhythm.     Pulses: Normal pulses.     Heart sounds: Normal heart sounds.  Pulmonary:     Effort: Pulmonary effort is normal.  Abdominal:     General: Abdomen is flat. Bowel sounds are normal.  Musculoskeletal:        General: Normal range of motion.     Cervical back: Normal range of motion.  Skin:    General: Skin is warm.  Neurological:     General: No focal deficit present.     Mental Status: He is alert.  Psychiatric:        Mood and Affect: Mood normal.       EKG:  NSR  sinus brady  [id number] Recent Labs: 05/09/2022: ALT 19; BUN 15; Creatinine, Ser 1.18; Hemoglobin 15.0; Platelets 210; Potassium 4.1; Sodium 141; TSH 1.640    Lipid Panel    Component Value Date/Time   CHOL 191 05/09/2022 0815   TRIG 90 05/09/2022 0815   HDL 50 05/09/2022 0815   CHOLHDL 3.8 05/09/2022 0815   LDLCALC 125 (H) 05/09/2022 0815      Other studies Reviewed: Additional studies/ records that were reviewed today include:  Review of the above records demonstrates:       No data to display            ASSESSMENT AND PLAN:    ICD-10-CM   1. Atrial fibrillation, currently in sinus rhythm  Z86.79    1 episode afib 150/min 3 minutes, but ekg sinus brady 46, qtc 416    2. Nonrheumatic mitral valve regurgitation  I34.0     3. Palpitation  R00.2     4. Dizziness  R42        Problem List Items Addressed This Visit   None Visit Diagnoses     Atrial fibrillation, currently in sinus rhythm    -  Primary   1 episode afib 150/min 3 minutes, but ekg sinus brady 46, qtc 416   Nonrheumatic mitral valve regurgitation       Palpitation       Dizziness              Disposition:   Return in about 4 weeks (around 12/19/2022).    Total time spent: 35 minutes  Signed,  Adrian Blackwater, MD  11/21/2022 9:42 AM    [facility]

## 2022-11-22 ENCOUNTER — Encounter: Payer: Self-pay | Admitting: Cardiovascular Disease

## 2022-12-01 ENCOUNTER — Other Ambulatory Visit: Payer: Self-pay | Admitting: Cardiovascular Disease

## 2023-01-02 ENCOUNTER — Ambulatory Visit: Payer: Managed Care, Other (non HMO) | Admitting: Cardiovascular Disease

## 2023-01-03 ENCOUNTER — Encounter: Payer: Self-pay | Admitting: Cardiovascular Disease

## 2023-01-03 ENCOUNTER — Ambulatory Visit (INDEPENDENT_AMBULATORY_CARE_PROVIDER_SITE_OTHER): Payer: Managed Care, Other (non HMO) | Admitting: Cardiovascular Disease

## 2023-01-03 VITALS — BP 122/80 | HR 63 | Ht 74.0 in | Wt 223.4 lb

## 2023-01-03 DIAGNOSIS — Z8679 Personal history of other diseases of the circulatory system: Secondary | ICD-10-CM | POA: Diagnosis not present

## 2023-01-03 DIAGNOSIS — I340 Nonrheumatic mitral (valve) insufficiency: Secondary | ICD-10-CM

## 2023-01-03 DIAGNOSIS — R002 Palpitations: Secondary | ICD-10-CM

## 2023-01-03 NOTE — Progress Notes (Signed)
Cardiology Office Note   Date:  01/03/2023   ID:  Brandon Welch, DOB [birth year], MRN [id number]  PCP:  Pcp, No  Cardiologist:  Adrian Blackwater, MD      History of Present Illness: Brandon Welch is a 52 y.o. male who presents for  Chief Complaint  Patient presents with   Follow-up    During working out or doing yard work, sweating a lot more    Feeling much better denies any palpitation or fluttering anymore.      Past Medical History:  Diagnosis Date   Arthritis    Low testosterone      Past Surgical History:  Procedure Laterality Date   EYE SURGERY Left 1975   INGUINAL HERNIA REPAIR Left 1977   Inguinal Hernia Repair   KNEE ARTHROSCOPY WITH ANTERIOR CRUCIATE LIGAMENT (ACL) REPAIR WITH HAMSTRING GRAFT Right 01/15/2015   Procedure: KNEE ARTHROSCOPY WITH ANTERIOR CRUCIATE LIGAMENT (ACL) REPAIR WITH HAMSTRING AUTOGRAFT;  Surgeon: Juanell Fairly, MD;  Location: [facility];  Service: Orthopedics;  Laterality: Right;     Current Outpatient Medications  Medication Sig Dispense Refill   azelastine (ASTELIN) 0.1 % nasal spray Place 2 sprays into both nostrils 2 (two) times daily. (Patient not taking: Reported on 08/22/2022)     fexofenadine (ALLEGRA) 180 MG tablet Take 180 mg by mouth daily. (Patient not taking: Reported on 11/14/2022)     fexofenadine-pseudoephedrine (ALLEGRA-D) 60-120 MG 12 hr tablet Take 1 tablet by mouth 2 (two) times daily. (Patient not taking: Reported on 08/22/2022) 20 tablet 0   loratadine (CLARITIN) 10 MG tablet Take 10 mg by mouth daily. (Patient not taking: Reported on 08/22/2022)     Multiple Vitamins-Minerals (CENTRUM SILVER 50+MEN PO) Take 1 tablet by mouth once.     orphenadrine (NORFLEX) 100 MG tablet Take 1 tablet (100 mg total) by mouth 2 (two) times daily. 10 tablet 1   sotalol (BETAPACE) 120 MG tablet TAKE 1 TABLET(120 MG) BY MOUTH DAILY 30 tablet 2   Testosterone 1.62 % GEL APPLY 4 PUMPS TOPICALLY TO THE AFFECTED AREA EVERY MORNING 150 g 2    No current facility-administered medications for this visit.    Allergies:   Aloe and Aloe vera concentrate    Social History:   reports that he has never smoked. He has never used smokeless tobacco. He reports that he does not drink alcohol and does not use drugs.   Family History:  family history includes Hypertension in his father; Lymphoma in his mother; Mental illness in his father.    ROS:     Review of Systems  Constitutional: Negative.   HENT: Negative.    Eyes: Negative.   Respiratory: Negative.    Gastrointestinal: Negative.   Genitourinary: Negative.   Musculoskeletal: Negative.   Skin: Negative.   Neurological: Negative.   Endo/Heme/Allergies: Negative.   Psychiatric/Behavioral: Negative.    All other systems reviewed and are negative.     All other systems are reviewed and negative.    PHYSICAL EXAM: VS:  BP 122/80   Pulse 63   Ht 6\' 2"  (1.88 m)   Wt 223 lb 6.4 oz (101.3 kg)   SpO2 97%   BMI 28.68 kg/m  , BMI Body mass index is 28.68 kg/m. Last weight:  Wt Readings from Last 3 Encounters:  01/03/23 223 lb 6.4 oz (101.3 kg)  11/21/22 227 lb 3.2 oz (103.1 kg)  11/14/22 220 lb (99.8 kg)     Physical Exam Vitals reviewed.  Constitutional:      Appearance: Normal appearance. He is normal weight.  HENT:     Head: Normocephalic.     Nose: Nose normal.     Mouth/Throat:     Mouth: Mucous membranes are moist.  Eyes:     Pupils: Pupils are equal, round, and reactive to light.  Cardiovascular:     Rate and Rhythm: Normal rate and regular rhythm.     Pulses: Normal pulses.     Heart sounds: Normal heart sounds.  Pulmonary:     Effort: Pulmonary effort is normal.  Abdominal:     General: Abdomen is flat. Bowel sounds are normal.  Musculoskeletal:        General: Normal range of motion.     Cervical back: Normal range of motion.  Skin:    General: Skin is warm.  Neurological:     General: No focal deficit present.     Mental Status: He is  alert.  Psychiatric:        Mood and Affect: Mood normal.       EKG:   Recent Labs: 05/09/2022: ALT 19; BUN 15; Creatinine, Ser 1.18; Hemoglobin 15.0; Platelets 210; Potassium 4.1; Sodium 141; TSH 1.640    Lipid Panel    Component Value Date/Time   CHOL 191 05/09/2022 0815   TRIG 90 05/09/2022 0815   HDL 50 05/09/2022 0815   CHOLHDL 3.8 05/09/2022 0815   LDLCALC 125 (H) 05/09/2022 0815      Other studies Reviewed: Additional studies/ records that were reviewed today include:  Review of the above records demonstrates:       No data to display            ASSESSMENT AND PLAN:    ICD-10-CM   1. Atrial fibrillation, currently in sinus rhythm  Z86.79    On sotalol 120 mg daily no episodes of palpitation.  Advise continuing current dosage.    2. Nonrheumatic mitral valve regurgitation  I34.0     3. Palpitation  R00.2        Problem List Items Addressed This Visit   None Visit Diagnoses     Atrial fibrillation, currently in sinus rhythm    -  Primary   On sotalol 120 mg daily no episodes of palpitation.  Advise continuing current dosage.   Nonrheumatic mitral valve regurgitation       Palpitation              Disposition:   Return in about 3 months (around 04/05/2023).    Total time spent: 30 minutes  Signed,  Adrian Blackwater, MD  01/03/2023 9:55 AM    [facility]

## 2023-01-19 ENCOUNTER — Ambulatory Visit: Payer: Self-pay | Admitting: Physician Assistant

## 2023-01-19 ENCOUNTER — Ambulatory Visit
Admission: RE | Admit: 2023-01-19 | Discharge: 2023-01-19 | Disposition: A | Discharge status: Home / Self Care | Payer: Managed Care, Other (non HMO) | Attending: Physician Assistant | Admitting: Physician Assistant

## 2023-01-19 ENCOUNTER — Ambulatory Visit
Admission: RE | Admit: 2023-01-19 | Discharge: 2023-01-19 | Disposition: A | Discharge status: Home / Self Care | Payer: Managed Care, Other (non HMO) | Source: Ambulatory Visit | Attending: Physician Assistant | Admitting: Physician Assistant

## 2023-01-19 ENCOUNTER — Other Ambulatory Visit: Payer: Self-pay | Admitting: Physician Assistant

## 2023-01-19 ENCOUNTER — Encounter: Payer: Self-pay | Admitting: Physician Assistant

## 2023-01-19 VITALS — BP 125/84 | HR 64 | Temp 97.1°F | Resp 12

## 2023-01-19 DIAGNOSIS — M79672 Pain in left foot: Secondary | ICD-10-CM

## 2023-01-19 DIAGNOSIS — M7989 Other specified soft tissue disorders: Secondary | ICD-10-CM | POA: Diagnosis not present

## 2023-01-19 DIAGNOSIS — M7662 Achilles tendinitis, left leg: Secondary | ICD-10-CM

## 2023-01-19 NOTE — Progress Notes (Signed)
Left Heel Pain x2 weeks ago - worse when he stands a lot (Still helps out at [employer])  No known injury   No OTC meds  AMD

## 2023-01-19 NOTE — Progress Notes (Signed)
   Subjective: Left posterior heel pain    Patient ID: Brandon Welch, male    DOB: Apr 29, 1970, 52 y.o.   MRN: [id number]  HPI Patient complains of 2 weeks of posterior left ankle pain.  Patient states pain increased with prolonged standing.  No other provocative measure for complaint.  No palliative measures for complaint.  Denies loss sensation or loss of function.   Review of Systems Hypogonadism and SVT.    Objective:   Physical Exam BP 125/84  BP Location Left Arm  Patient Position Sitting  Cuff Size Large  Pulse 64  Resp 12  Temp 97.1 F (36.2 C)  Temp src Temporal  SpO2 97 %  No acute distress.  Normal gait. Examination of the left posterior ankle reveals no edema, erythema, ecchymosis.  Patient has mild to moderate guarding with palpation of the Achilles tendon.  Patient states discomfort increases with dorsiflexion.  Patient will have imaging done and follow-up in 4 days.  Advised NSAIDs for 3 to 5 days.       Assessment & Plan: Achilles tendinitis

## 2023-02-08 ENCOUNTER — Other Ambulatory Visit: Payer: Self-pay | Admitting: Physician Assistant

## 2023-02-08 MED ORDER — MELOXICAM 15 MG PO TABS: 15.0000 mg | ORAL_TABLET | Freq: Every day | ORAL | 0 refills | Status: DC

## 2023-02-22 ENCOUNTER — Other Ambulatory Visit: Payer: Self-pay

## 2023-02-22 DIAGNOSIS — E291 Testicular hypofunction: Secondary | ICD-10-CM

## 2023-02-22 MED ORDER — TESTOSTERONE 1.62 % TD GEL
TRANSDERMAL | 2 refills | Status: DC
Start: 2023-02-22 — End: 2023-06-15

## 2023-03-04 ENCOUNTER — Other Ambulatory Visit: Payer: Self-pay | Admitting: Cardiovascular Disease

## 2023-04-03 DIAGNOSIS — M25561 Pain in right knee: Secondary | ICD-10-CM | POA: Diagnosis not present

## 2023-04-07 ENCOUNTER — Encounter: Payer: Self-pay | Admitting: Cardiovascular Disease

## 2023-04-07 ENCOUNTER — Ambulatory Visit (INDEPENDENT_AMBULATORY_CARE_PROVIDER_SITE_OTHER): Payer: Managed Care, Other (non HMO) | Admitting: Cardiovascular Disease

## 2023-04-07 VITALS — BP 128/74 | HR 56 | Ht 74.0 in | Wt 228.9 lb

## 2023-04-07 DIAGNOSIS — Z8679 Personal history of other diseases of the circulatory system: Secondary | ICD-10-CM | POA: Diagnosis not present

## 2023-04-07 DIAGNOSIS — R002 Palpitations: Secondary | ICD-10-CM

## 2023-04-07 DIAGNOSIS — I471 Supraventricular tachycardia, unspecified: Secondary | ICD-10-CM

## 2023-04-07 DIAGNOSIS — I340 Nonrheumatic mitral (valve) insufficiency: Secondary | ICD-10-CM | POA: Diagnosis not present

## 2023-04-07 MED ORDER — ASPIRIN 81 MG PO TBEC: 81.0000 mg | DELAYED_RELEASE_TABLET | Freq: Every day | ORAL | 12 refills | Status: AC

## 2023-04-07 NOTE — Progress Notes (Signed)
 Cardiology Office Note   Date:  04/07/2023   ID:  Brandon Welch, DOB 11/29/1970, MRN [id number]  PCP:  Pcp, No  Cardiologist:  Denyse Bathe, MD      History of Present Illness: Brandon Welch is a 53 y.o. male who presents for  Chief Complaint  Patient presents with   Follow-up    2 weeks ago- 1/3 HR 170 continued for about 3+ mins    Feel tired a some what      Past Medical History:  Diagnosis Date   Arthritis    Low testosterone       Past Surgical History:  Procedure Laterality Date   EYE SURGERY Left 1975   INGUINAL HERNIA REPAIR Left 1977   Inguinal Hernia Repair   KNEE ARTHROSCOPY WITH ANTERIOR CRUCIATE LIGAMENT (ACL) REPAIR WITH HAMSTRING GRAFT Right 01/15/2015   Procedure: KNEE ARTHROSCOPY WITH ANTERIOR CRUCIATE LIGAMENT (ACL) REPAIR WITH HAMSTRING AUTOGRAFT;  Surgeon: Franky Cranker, MD;  Location: [facility] ORS;  Service: Orthopedics;  Laterality: Right;     Current Outpatient Medications  Medication Sig Dispense Refill   aspirin  EC 81 MG tablet Take 1 tablet (81 mg total) by mouth daily. Swallow whole. 30 tablet 12   fexofenadine  (ALLEGRA) 180 MG tablet Take 180 mg by mouth daily. (Patient not taking: Reported on 11/14/2022)     loratadine (CLARITIN) 10 MG tablet Take 10 mg by mouth daily. (Patient not taking: Reported on 08/22/2022)     meloxicam  (MOBIC ) 15 MG tablet Take 1 tablet (15 mg total) by mouth daily. 30 tablet 0   Multiple Vitamins-Minerals (CENTRUM SILVER 50+MEN PO) Take 1 tablet by mouth once.     sotalol  (BETAPACE ) 120 MG tablet TAKE 1 TABLET(120 MG) BY MOUTH DAILY 30 tablet 2   Testosterone  1.62 % GEL APPLY 4 PUMPS TOPICALLY TO THE AFFECTED AREA EVERY MORNING 150 g 2   No current facility-administered medications for this visit.    Allergies:   Aloe and Aloe vera concentrate    Social History:   reports that he has never smoked. He has never used smokeless tobacco. He reports that he does not drink alcohol and does not use drugs.    Family History:  family history includes Hypertension in his father; Lymphoma in his mother; Mental illness in his father.    ROS:     Review of Systems  Constitutional: Negative.   HENT: Negative.    Eyes: Negative.   Respiratory: Negative.    Gastrointestinal: Negative.   Genitourinary: Negative.   Musculoskeletal: Negative.   Skin: Negative.   Neurological: Negative.   Endo/Heme/Allergies: Negative.   Psychiatric/Behavioral: Negative.    All other systems reviewed and are negative.     All other systems are reviewed and negative.    PHYSICAL EXAM: VS:  BP 128/74   Pulse (!) 56   Ht 6' 2 (1.88 m)   Wt 228 lb 14.4 oz (103.8 kg)   SpO2 99%   BMI 29.39 kg/m  , BMI Body mass index is 29.39 kg/m. Last weight:  Wt Readings from Last 3 Encounters:  04/07/23 228 lb 14.4 oz (103.8 kg)  01/03/23 223 lb 6.4 oz (101.3 kg)  11/21/22 227 lb 3.2 oz (103.1 kg)     Physical Exam Vitals reviewed.  Constitutional:      Appearance: Normal appearance. He is normal weight.  HENT:     Head: Normocephalic.     Nose: Nose normal.     Mouth/Throat:  Mouth: Mucous membranes are moist.  Eyes:     Pupils: Pupils are equal, round, and reactive to light.  Cardiovascular:     Rate and Rhythm: Normal rate and regular rhythm.     Pulses: Normal pulses.     Heart sounds: Normal heart sounds.  Pulmonary:     Effort: Pulmonary effort is normal.  Abdominal:     General: Abdomen is flat. Bowel sounds are normal.  Musculoskeletal:        General: Normal range of motion.     Cervical back: Normal range of motion.  Skin:    General: Skin is warm.  Neurological:     General: No focal deficit present.     Mental Status: He is alert.  Psychiatric:        Mood and Affect: Mood normal.       EKG:   Recent Labs: 05/09/2022: ALT 19; BUN 15; Creatinine, Ser 1.18; Hemoglobin 15.0; Platelets 210; Potassium 4.1; Sodium 141; TSH 1.640    Lipid Panel    Component Value Date/Time    CHOL 191 05/09/2022 0815   TRIG 90 05/09/2022 0815   HDL 50 05/09/2022 0815   CHOLHDL 3.8 05/09/2022 0815   LDLCALC 125 (H) 05/09/2022 0815      Other studies Reviewed: Additional studies/ records that were reviewed today include:  Review of the above records demonstrates:       No data to display            ASSESSMENT AND PLAN:    ICD-10-CM   1. Atrial fibrillation, currently in sinus rhythm  Z86.79    Had another episode but appears to be afib on phone monitoring not SVT, but CHAD score 0, so will give asp, does not want elliquis. Take 1/2 tab sotolol prn    2. Nonrheumatic mitral valve regurgitation  I34.0     3. Palpitation  R00.2     4. SVT (supraventricular tachycardia) (HCC)  I47.10        Problem List Items Addressed This Visit       Cardiovascular and Mediastinum   SVT (supraventricular tachycardia) (HCC)   Relevant Medications   aspirin  EC 81 MG tablet   Other Visit Diagnoses       Atrial fibrillation, currently in sinus rhythm    -  Primary   Had another episode but appears to be afib on phone monitoring not SVT, but CHAD score 0, so will give asp, does not want elliquis. Take 1/2 tab sotolol prn     Nonrheumatic mitral valve regurgitation       Relevant Medications   aspirin  EC 81 MG tablet     Palpitation              Disposition:   Return in about 2 months (around 06/05/2023).    Total time spent: 30 minutes  Signed,  Denyse Bathe, MD  04/07/2023 9:23 AM    [facility]

## 2023-04-14 ENCOUNTER — Ambulatory Visit: Payer: Self-pay

## 2023-04-14 DIAGNOSIS — M25561 Pain in right knee: Secondary | ICD-10-CM | POA: Diagnosis not present

## 2023-04-14 DIAGNOSIS — E291 Testicular hypofunction: Secondary | ICD-10-CM

## 2023-04-14 DIAGNOSIS — Z0000 Encounter for general adult medical examination without abnormal findings: Secondary | ICD-10-CM

## 2023-04-14 LAB — POCT URINALYSIS DIPSTICK
Bilirubin, UA: NEGATIVE
Blood, UA: NEGATIVE
Glucose, UA: NEGATIVE
Ketones, UA: NEGATIVE
Leukocytes, UA: NEGATIVE
Nitrite, UA: NEGATIVE
Protein, UA: NEGATIVE
Spec Grav, UA: 1.025 (ref 1.010–1.025)
Urobilinogen, UA: 0.2 U/dL
pH, UA: 6 (ref 5.0–8.0)

## 2023-04-14 NOTE — Progress Notes (Signed)
Pt completed labs for physical. /CL,RMA 

## 2023-04-16 LAB — CMP12+LP+TP+TSH+6AC+PSA+CBC…
ALT: 18 [IU]/L (ref 0–44)
AST: 37 [IU]/L (ref 0–40)
Albumin: 4.7 g/dL (ref 3.8–4.9)
Alkaline Phosphatase: 75 [IU]/L (ref 44–121)
BUN/Creatinine Ratio: 17 (ref 9–20)
BUN: 19 mg/dL (ref 6–24)
Basophils Absolute: 0.1 10*3/uL (ref 0.0–0.2)
Basos: 1 %
Bilirubin Total: 0.6 mg/dL (ref 0.0–1.2)
Calcium: 9.5 mg/dL (ref 8.7–10.2)
Chloride: 103 mmol/L (ref 96–106)
Chol/HDL Ratio: 4.4 {ratio} (ref 0.0–5.0)
Cholesterol, Total: 190 mg/dL (ref 100–199)
Creatinine, Ser: 1.11 mg/dL (ref 0.76–1.27)
EOS (ABSOLUTE): 0.1 10*3/uL (ref 0.0–0.4)
Eos: 3 %
Estimated CHD Risk: 0.9 times avg. (ref 0.0–1.0)
Free Thyroxine Index: 2 (ref 1.2–4.9)
GGT: 12 [IU]/L (ref 0–65)
Globulin, Total: 2.2 g/dL (ref 1.5–4.5)
Glucose: 93 mg/dL (ref 70–99)
HDL: 43 mg/dL (ref >39)
Hematocrit: 45.3 % (ref 37.5–51.0)
Hemoglobin: 14.7 g/dL (ref 13.0–17.7)
Immature Grans (Abs): 0 10*3/uL (ref 0.0–0.1)
Immature Granulocytes: 0 %
Iron: 90 ug/dL (ref 38–169)
LDH: 188 [IU]/L (ref 121–224)
LDL Chol Calc (NIH): 136 mg/dL — ABNORMAL HIGH (ref 0–99)
Lymphocytes Absolute: 1.5 10*3/uL (ref 0.7–3.1)
Lymphs: 33 %
MCH: 28.2 pg (ref 26.6–33.0)
MCHC: 32.5 g/dL (ref 31.5–35.7)
MCV: 87 fL (ref 79–97)
Monocytes Absolute: 0.5 10*3/uL (ref 0.1–0.9)
Monocytes: 11 %
Neutrophils Absolute: 2.4 10*3/uL (ref 1.4–7.0)
Neutrophils: 52 %
Phosphorus: 3.3 mg/dL (ref 2.8–4.1)
Platelets: 208 10*3/uL (ref 150–450)
Potassium: 4.6 mmol/L (ref 3.5–5.2)
Prostate Specific Ag, Serum: 0.9 ng/mL (ref 0.0–4.0)
RBC: 5.22 x10E6/uL (ref 4.14–5.80)
RDW: 12.2 % (ref 11.6–15.4)
Sodium: 142 mmol/L (ref 134–144)
T3 Uptake Ratio: 31 % (ref 24–39)
T4, Total: 6.5 ug/dL (ref 4.5–12.0)
TSH: 2.09 u[IU]/mL (ref 0.450–4.500)
Total Protein: 6.9 g/dL (ref 6.0–8.5)
Triglycerides: 61 mg/dL (ref 0–149)
Uric Acid: 5.7 mg/dL (ref 3.8–8.4)
VLDL Cholesterol Cal: 11 mg/dL (ref 5–40)
WBC: 4.7 10*3/uL (ref 3.4–10.8)
eGFR: 80 mL/min/{1.73_m2} (ref >59)

## 2023-04-16 LAB — HGB A1C W/O EAG: Hgb A1c MFr Bld: 5.1 % (ref 4.8–5.6)

## 2023-04-16 LAB — TESTOSTERONE: Testosterone: 424 ng/dL (ref 264–916)

## 2023-04-21 DIAGNOSIS — M1711 Unilateral primary osteoarthritis, right knee: Secondary | ICD-10-CM | POA: Diagnosis not present

## 2023-05-01 ENCOUNTER — Ambulatory Visit: Payer: 59 | Admitting: Physician Assistant

## 2023-05-01 ENCOUNTER — Encounter: Payer: Self-pay | Admitting: Physician Assistant

## 2023-05-01 VITALS — BP 117/51 | HR 60 | Temp 97.3°F | Resp 14 | Ht 74.0 in | Wt 221.0 lb

## 2023-05-01 DIAGNOSIS — Z0000 Encounter for general adult medical examination without abnormal findings: Secondary | ICD-10-CM

## 2023-05-01 NOTE — Progress Notes (Signed)
[facility] ____________________________________________   None    (approximate)  I have reviewed the triage vital signs and the nursing notes.   HISTORY  Chief Complaint No chief complaint on file.   HPI Brandon Welch is a 53 y.o. male patient presents for annual physical exam.  Patient was no concerning complaints.         Past Medical History:  Diagnosis Date   Arthritis    Low testosterone     Patient Active Problem List   Diagnosis Date Noted   SVT (supraventricular tachycardia) (HCC) 05/30/2022   Hypogonadism in male 06/03/2019   Hearing loss due to cerumen impaction, right 11/29/2018   Acute upper respiratory infection 11/29/2018   Dysfunction of right eustachian tube 11/29/2018   History of repair of ACL 11/29/2018   Scrotal pain 09/01/2014    Past Surgical History:  Procedure Laterality Date   EYE SURGERY Left 1975   INGUINAL HERNIA REPAIR Left 1977   Inguinal Hernia Repair   KNEE ARTHROSCOPY WITH ANTERIOR CRUCIATE LIGAMENT (ACL) REPAIR WITH HAMSTRING GRAFT Right 01/15/2015   Procedure: KNEE ARTHROSCOPY WITH ANTERIOR CRUCIATE LIGAMENT (ACL) REPAIR WITH HAMSTRING AUTOGRAFT;  Surgeon: Juanell Fairly, MD;  Location: [facility] OR[facility];  Service: Orthopedics;  Laterality: Right;    Prior to Admission medications   Medication Sig Start Date End Date Taking? Authorizing Provider  aspirin EC 81 MG tablet Take 1 tablet (81 mg total) by mouth daily. Swallow whole. 04/07/23  Yes Laurier Nancy, MD  Multiple Vitamins-Minerals (CENTRUM SILVER 50+MEN PO) Take 1 tablet by mouth once.   Yes [provider]  sotalol (BETAPACE) 120 MG tablet TAKE 1 TABLET(120 MG) BY MOUTH DAILY 03/06/23  Yes Adrian Blackwater A, MD  Testosterone 1.62 % GEL APPLY 4 PUMPS TOPICALLY TO THE AFFECTED AREA EVERY MORNING 02/22/23  Yes Joni Reining, PA-C  fexofenadine (ALLEGRA) 180 MG tablet Take 180 mg by mouth daily. Patient not taking: Reported on 05/01/2023     [provider]  loratadine (CLARITIN) 10 MG tablet Take 10 mg by mouth daily. Patient not taking: Reported on 05/01/2023    [provider]  meloxicam (MOBIC) 15 MG tablet Take 1 tablet (15 mg total) by mouth daily. Patient not taking: Reported on 05/01/2023 02/08/23   Joni Reining, PA-C    Allergies Aloe and Aloe vera concentrate  Family History  Problem Relation Age of Onset   Lymphoma Mother    Hypertension Father    Mental illness Father     Social History Social History   Tobacco Use   Smoking status: Never   Smokeless tobacco: Never  Substance Use Topics   Alcohol use: No   Drug use: No    Review of Systems Constitutional: No fever/chills Eyes: No visual changes. ENT: No sore throat. Cardiovascular: Denies chest pain.  History of SVT Respiratory: Denies shortness of breath. Gastrointestinal: No abdominal pain.  No nausea, no vomiting.  No diarrhea.  No constipation. Genitourinary: Negative for dysuria. Musculoskeletal: Negative for back pain. Skin: Negative for rash. Neurological: Negative for headaches, focal weakness or numbness.  ____________________________________________   PHYSICAL EXAM:  VITAL SIGNS: BP 117/51BP. 117/51. Data is abnormal. Taken on 05/01/23 8:03 AM  Pulse Rate 60  Temp 97.3 F (36.3 C)Temp. 97.3 F (36.3 C). Data is abnormal. Taken on 05/01/23 8:03 AM  Weight 221 lb (100.2 kg)  Height 6\' 2"  (1.88 m)  Resp 14  SpO2 96 %   BMI: 28.37 kg/m2  BSA: 2.29 m2   Constitutional: Alert and oriented. Well appearing and in no acute distress. Eyes: Conjunctivae are normal. PERRL. EOMI. Head: Atraumatic. Nose: No congestion/rhinnorhea. Mouth/Throat: Mucous membranes are moist.  Oropharynx non-erythematous. Neck: No stridor.  No cervical spine tenderness to palpation. Hematological/Lymphatic/Immunilogical: No cervical lymphadenopathy. Cardiovascular: Normal rate, regular rhythm. Grossly normal heart sounds.  Good  peripheral circulation. Respiratory: Normal respiratory effort.  No retractions. Lungs CTAB. Gastrointestinal: Soft and nontender. No distention. No abdominal bruits. No CVA tenderness. Genitourinary: Deferred Musculoskeletal: No lower extremity tenderness nor edema.  No joint effusions. Neurologic:  Normal speech and language. No gross focal neurologic deficits are appreciated. No gait instability. Skin:  Skin is warm, dry and intact. No rash noted. Psychiatric: Mood and affect are normal. Speech and behavior are normal.  ____________________________________________   LABS CMP12+LP+TP+TSH+6AC+PSA+CBC. Order: [id number]  Status: Final result     Next appt: 06/09/2023 at 09:00 AM in Internal Medicine Adrian Blackwater, MD)     Dx: Routine adult health maintenance     Test Result Released: Yes (seen)   0 Result Notes          Component Ref Range & Units (hover) 2 wk ago (04/14/23) 11 mo ago (05/09/22) 2 yr ago (04/26/21) 2 yr ago (05/18/20) 4 yr ago (04/01/19) 8 yr ago (01/13/15) 8 yr ago (01/13/15)  Glucose 93 98 90 92 R 93 R 97 R   Uric Acid 5.7 5.1 CM 4.7 CM 5.5 CM 5.0 CM    Comment:            Therapeutic target for gout patients: <6.0  BUN 19 15 18 12 15 21  High  R   Creatinine, Ser 1.11 1.18 1.14 1.09 CM 1.17 1.08 R   eGFR 80 75 79      BUN/Creatinine Ratio 17 13 16 11 13     Sodium 142 141 143 146 High  140 141 R   Potassium 4.6 4.1 4.5 4.1 4.2 3.7 R   Chloride 103 104 106 102 104 105 R   Calcium 9.5 9.4 9.6 9.4 9.4 9.5 R   Phosphorus 3.3 3.6 3.5 3.1 3.5    Total Protein 6.9 7.1 6.9 6.8 6.9    Albumin 4.7 4.7 R 4.7 R 4.9 R 4.6 R    Globulin, Total 2.2 2.4 2.2 1.9 2.3    Bilirubin Total 0.6 0.5 0.6 0.5 0.4    Alkaline Phosphatase 75 83 69 75 82 R    LDH 188 184 167 192 186    AST 37 37 41 High  53 High  43 High     ALT 18 19 17  32 20    GGT 12 12 12 12 11     Iron 90 79 94 121 78    Cholesterol, Total 190 191 176 179 171    Triglycerides 61 90 63 99 81    HDL 43 50 49  48 50    VLDL Cholesterol Cal 11 16 12 18 15     LDL Chol Calc (NIH) 136 High  125 High  115 High  113 High  106 High     Chol/HDL Ratio 4.4 3.8 CM 3.6 CM 3.7 CM 3.4 CM    Comment:                                   T. Chol/HDL Ratio  Men  Women                               1/2 Avg.Risk  3.4    3.3                                   Avg.Risk  5.0    4.4                                2X Avg.Risk  9.6    7.1                                3X Avg.Risk 23.4   11.0  Estimated CHD Risk 0.9 0.7 CM 0.6 CM 0.6 CM 0.5 CM    Comment: The CHD Risk is based on the T. Chol/HDL ratio. Other factors affect CHD Risk such as hypertension, smoking, diabetes, severe obesity, and family history of premature CHD.  TSH 2.090 1.640 1.030 1.620 1.570    T4, Total 6.5 6.7 5.7 6.4 5.5    T3 Uptake Ratio 31 32 28 29 26     Free Thyroxine Index 2.0 2.1 1.6 1.9 1.4    Prostate Specific Ag, Serum 0.9 1.0 CM 1.4 CM 1.1 CM 1.5 CM    Comment: Roche ECLIA methodology. According to the American Urological Association, Serum PSA should decrease and remain at undetectable levels after radical prostatectomy. The AUA defines biochemical recurrence as an initial PSA value 0.2 ng/mL or greater followed by a subsequent confirmatory PSA value 0.2 ng/mL or greater. Values obtained with different assay methods or kits cannot be used interchangeably. Results cannot be interpreted as absolute evidence of the presence or absence of malignant disease.  WBC 4.7 5.1 5.2 5.1 5.6  7.0 R  RBC 5.22 5.18 5.07 5.13 4.90  4.98 R  Hemoglobin 14.7 15.0 14.5 14.3 13.7  14.2 R  Hematocrit 45.3 44.0 41.4 42.7 41.2  40.9 R  MCV 87 85 82 83 84  82.2 R  MCH 28.2 29.0 28.6 27.9 28.0  28.5 R  MCHC 32.5 34.1 35.0 33.5 33.3  34.7 R  RDW 12.2 12.5 12.7 12.1 12.2  12.9 R  Platelets 208 210 191 219 192  188 R  Neutrophils 52 53 57 56 62    Lymphs 33 31 30 29 27     Monocytes 11 9 10 11 8     Eos 3 6 2 3 2      Basos 1 1 1 1 1     Neutrophils Absolute 2.4 2.7 2.9 2.9 3.4    Lymphocytes Absolute 1.5 1.5 1.5 1.5 1.5    Monocytes Absolute 0.5 0.4 0.5 0.6 0.5    EOS (ABSOLUTE) 0.1 0.3 0.1 0.2 0.1    Basophils Absolute 0.1 0.0 0.1 0.1 0.1    Immature Granulocytes 0 0 0 0 0    Immature Grans (Abs) 0.0 0.0 0.0 0.0 0.0    Resulting Agency [facility]         Narrative Performed by: Verdell Carmine Performed at:  [address] [facility] [address], [place], [place]  [id number] Lab Director: Jolene Schimke MD, Phone:  [phone]  Specimen Collected: 04/14/23 08:53 Last Resulted: 04/16/23 08:08  Lab [facility] manager History     View All Conversations on this Encounter    CM=Additional comments  R=Reference range differs from most recent result in table    Result Care Coordination   Patient Communication   Add Comments   Seen Back to Top     Hgb A1c w/o eAG Order: [id number]  Status: Final result      Next appt: 06/09/2023 at 09:00 AM in Internal Medicine Adrian Blackwater, MD)      Dx: Routine adult health maintenance      Test Result Released: Yes (seen)    0 Result Notes      Component Ref Range & Units (hover) 2 wk ago  Hgb A1c MFr Bld 5.1  Comment:          Prediabetes: 5.7 - 6.4          Diabetes: >6.4          Glycemic control for adults with diabetes: <7.0  Resulting Agency [facility]         Narrative Performed by: Verdell Carmine Performed at:  [address] - [facility] [address] [place], [place]  [id number] Lab Director: Jolene Schimke MD, Phone:  [phone]  Specimen Collected: 04/14/23 08:53 Last Resulted: 04/16/23 08:08      Lab Flowsheet       Order Details       View Encounter       Lab and Collection Details       Routing       Result History     View All Conversations on this Encounter     Result Care Coordination   Patient Communication   Add Comments   Seen Back to Top     Testosterone Order: [id number]  Status: Final result      Next appt: 06/09/2023 at 09:00 AM in Internal Medicine Adrian Blackwater, MD)      Dx: Hypogonadism in male      Test Result Released: Yes (seen)    0 Result Notes        Component Ref Range & Units (hover) 2 wk ago 2 yr ago 4 yr ago  Testosterone 424 345 CM 412 CM  Comment: Adult male reference interval is based on a population of healthy nonobese males (BMI <30) between 9 and 25 years old.    ___           Component Ref Range & Units (hover) 2 wk ago (04/14/23) 11 mo ago (05/09/22) 2 yr ago (04/26/21) 2 yr ago (05/18/20) 3 yr ago (02/17/20) 4 yr ago (04/01/19) 8 yr ago (01/13/15)  Color, UA yellow yellow Amber yellow Light Yellow Yellow VC   Clarity, UA clear clear Clear negaitve Clear Clear   Glucose, UA Negative Negative Negative Negative Negative Negative   Bilirubin, UA neg neg Negative negative Negative Negative   Ketones, UA neg neg Negative negative Negative Negative   Spec Grav, UA 1.025 >=1.030 Abnormal  >=1.030 Abnormal  1.020 1.025 >=1.030 Abnormal    Blood, UA neg neg Negative negative Negative Negative   pH, UA 6.0 5.5 6.0 6.0 6.0 5.5 VC   Protein, UA Negative Negative Negative Negative Negative Negative   Urobilinogen, UA 0.2 0.2 0.2 0.2 0.2 0.2   Nitrite, UA neg  neg Negative negative Negative Negative   Leukocytes, UA Negative Negative Negative Negative Negative Negative NEGATIVE R  Appearance  medium  negative   CLEAR Abnormal  R             _________________________________________  EKG Sinus bradycardia 54 bpm  ____________________________________________   ____________________________________________   INITIAL IMPRESSION / ASSESSMENT AND PLAN   As part of my medical decision making, I reviewed the following data within the electronic MEDICAL RECORD NUMBER         No acute findings on  physical exam, labs, the EKG.  @EDCOURSE @   ____________________________________________   FINAL CLINICAL IMPRESSION Well exam   ED Discharge Orders     None        Note:  This document was prepared using Dragon voice recognition software and may include unintentional dictation errors.

## 2023-05-01 NOTE — Progress Notes (Signed)
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  Denies any complaints.

## 2023-06-08 ENCOUNTER — Other Ambulatory Visit: Payer: Self-pay | Admitting: Cardiovascular Disease

## 2023-06-09 ENCOUNTER — Ambulatory Visit: Payer: Managed Care, Other (non HMO) | Admitting: Cardiovascular Disease

## 2023-06-09 ENCOUNTER — Encounter: Payer: Self-pay | Admitting: Cardiovascular Disease

## 2023-06-09 VITALS — BP 118/68 | HR 70 | Ht 74.0 in | Wt 225.0 lb

## 2023-06-09 DIAGNOSIS — R002 Palpitations: Secondary | ICD-10-CM

## 2023-06-09 DIAGNOSIS — I340 Nonrheumatic mitral (valve) insufficiency: Secondary | ICD-10-CM

## 2023-06-09 DIAGNOSIS — R42 Dizziness and giddiness: Secondary | ICD-10-CM | POA: Diagnosis not present

## 2023-06-09 DIAGNOSIS — Z8679 Personal history of other diseases of the circulatory system: Secondary | ICD-10-CM

## 2023-06-09 DIAGNOSIS — I471 Supraventricular tachycardia, unspecified: Secondary | ICD-10-CM

## 2023-06-09 DIAGNOSIS — Z0130 Encounter for examination of blood pressure without abnormal findings: Secondary | ICD-10-CM

## 2023-06-09 NOTE — Progress Notes (Signed)
 Cardiology Office Note   Date:  06/09/2023   ID:  Brandon Welch, DOB 10/29/1970, MRN [id number]  PCP:  Pcp, No  Cardiologist:  Adrian Blackwater, MD      History of Present Illness: Brandon Welch is a 53 y.o. male who presents for  Chief Complaint  Patient presents with   Follow-up    2 month follow up  2/28-173HR 2/11- 184HR    2 episode lasting few minutes palpitation, low energy, gainng weight      Past Medical History:  Diagnosis Date   Arthritis    Low testosterone      Past Surgical History:  Procedure Laterality Date   EYE SURGERY Left 1975   INGUINAL HERNIA REPAIR Left 1977   Inguinal Hernia Repair   KNEE ARTHROSCOPY WITH ANTERIOR CRUCIATE LIGAMENT (ACL) REPAIR WITH HAMSTRING GRAFT Right 01/15/2015   Procedure: KNEE ARTHROSCOPY WITH ANTERIOR CRUCIATE LIGAMENT (ACL) REPAIR WITH HAMSTRING AUTOGRAFT;  Surgeon: Juanell Fairly, MD;  Location: [facility] OR[facility];  Service: Orthopedics;  Laterality: Right;     Current Outpatient Medications  Medication Sig Dispense Refill   sotalol (BETAPACE) 120 MG tablet TAKE 1 TABLET(120 MG) BY MOUTH DAILY 30 tablet 2   aspirin EC 81 MG tablet Take 1 tablet (81 mg total) by mouth daily. Swallow whole. 30 tablet 12   fexofenadine (ALLEGRA) 180 MG tablet Take 180 mg by mouth daily. (Patient not taking: Reported on 05/01/2023)     loratadine (CLARITIN) 10 MG tablet Take 10 mg by mouth daily. (Patient not taking: Reported on 05/01/2023)     meloxicam (MOBIC) 15 MG tablet Take 1 tablet (15 mg total) by mouth daily. (Patient not taking: Reported on 05/01/2023) 30 tablet 0   Multiple Vitamins-Minerals (CENTRUM SILVER 50+MEN PO) Take 1 tablet by mouth once.     Testosterone 1.62 % GEL APPLY 4 PUMPS TOPICALLY TO THE AFFECTED AREA EVERY MORNING 150 g 2   No current facility-administered medications for this visit.    Allergies:   Aloe and Aloe vera concentrate    Social History:   reports that he has never smoked. He has never used smokeless  tobacco. He reports that he does not drink alcohol and does not use drugs.   Family History:  family history includes Hypertension in his father; Lymphoma in his mother; Mental illness in his father.    ROS:     Review of Systems  Constitutional: Negative.   HENT: Negative.    Eyes: Negative.   Respiratory: Negative.    Gastrointestinal: Negative.   Genitourinary: Negative.   Musculoskeletal: Negative.   Skin: Negative.   Neurological: Negative.   Endo/Heme/Allergies: Negative.   Psychiatric/Behavioral: Negative.    All other systems reviewed and are negative.     All other systems are reviewed and negative.    PHYSICAL EXAM: VS:  BP 118/68   Pulse 70   Ht 6\' 2"  (1.88 m)   Wt 225 lb (102.1 kg)   SpO2 98%   BMI 28.89 kg/m  , BMI Body mass index is 28.89 kg/m. Last weight:  Wt Readings from Last 3 Encounters:  06/09/23 225 lb (102.1 kg)  05/01/23 221 lb (100.2 kg)  04/07/23 228 lb 14.4 oz (103.8 kg)     Physical Exam Vitals reviewed.  Constitutional:      Appearance: Normal appearance. He is normal weight.  HENT:     Head: Normocephalic.     Nose: Nose normal.     Mouth/Throat:  Mouth: Mucous membranes are moist.  Eyes:     Pupils: Pupils are equal, round, and reactive to light.  Cardiovascular:     Rate and Rhythm: Normal rate and regular rhythm.     Pulses: Normal pulses.     Heart sounds: Normal heart sounds.  Pulmonary:     Effort: Pulmonary effort is normal.  Abdominal:     General: Abdomen is flat. Bowel sounds are normal.  Musculoskeletal:        General: Normal range of motion.     Cervical back: Normal range of motion.  Skin:    General: Skin is warm.  Neurological:     General: No focal deficit present.     Mental Status: He is alert.  Psychiatric:        Mood and Affect: Mood normal.       EKG:   Recent Labs: 04/14/2023: ALT 18; BUN 19; Creatinine, Ser 1.11; Hemoglobin 14.7; Platelets 208; Potassium 4.6; Sodium 142; TSH 2.090     Lipid Panel    Component Value Date/Time   CHOL 190 04/14/2023 0853   TRIG 61 04/14/2023 0853   HDL 43 04/14/2023 0853   CHOLHDL 4.4 04/14/2023 0853   LDLCALC 136 (H) 04/14/2023 0853      Other studies Reviewed: Additional studies/ records that were reviewed today include:  Review of the above records demonstrates:       No data to display            ASSESSMENT AND PLAN:    ICD-10-CM   1. Atrial fibrillation, currently in sinus rhythm  Z86.79    2 episode but wants to continue current dosage, may increase sotolol dosage if continue    2. Nonrheumatic mitral valve regurgitation  I34.0     3. Palpitation  R00.2     4. SVT (supraventricular tachycardia) (HCC)  I47.10     5. Dizziness  R42        Problem List Items Addressed This Visit       Cardiovascular and Mediastinum   SVT (supraventricular tachycardia) (HCC)   Other Visit Diagnoses       Atrial fibrillation, currently in sinus rhythm    -  Primary   2 episode but wants to continue current dosage, may increase sotolol dosage if continue     Nonrheumatic mitral valve regurgitation         Palpitation         Dizziness              Disposition:   Return in about 3 months (around 09/09/2023).    Total time spent: 30 minutes  Signed,  Adrian Blackwater, MD  06/09/2023 9:35 AM    [facility]

## 2023-06-15 ENCOUNTER — Other Ambulatory Visit: Payer: Self-pay

## 2023-06-15 DIAGNOSIS — E291 Testicular hypofunction: Secondary | ICD-10-CM

## 2023-06-15 MED ORDER — TESTOSTERONE 1.62 % TD GEL: TRANSDERMAL | 2 refills | Status: DC

## 2023-09-07 ENCOUNTER — Other Ambulatory Visit: Payer: Self-pay | Admitting: Cardiovascular Disease

## 2023-09-15 ENCOUNTER — Encounter: Payer: Self-pay | Admitting: Cardiovascular Disease

## 2023-09-15 ENCOUNTER — Ambulatory Visit (INDEPENDENT_AMBULATORY_CARE_PROVIDER_SITE_OTHER): Admitting: Cardiovascular Disease

## 2023-09-15 VITALS — BP 102/70 | HR 72 | Ht 74.0 in | Wt 222.8 lb

## 2023-09-15 DIAGNOSIS — Z0130 Encounter for examination of blood pressure without abnormal findings: Secondary | ICD-10-CM

## 2023-09-15 DIAGNOSIS — I4891 Unspecified atrial fibrillation: Secondary | ICD-10-CM | POA: Diagnosis not present

## 2023-09-15 DIAGNOSIS — Z8679 Personal history of other diseases of the circulatory system: Secondary | ICD-10-CM | POA: Diagnosis not present

## 2023-09-15 DIAGNOSIS — R42 Dizziness and giddiness: Secondary | ICD-10-CM | POA: Diagnosis not present

## 2023-09-15 DIAGNOSIS — R002 Palpitations: Secondary | ICD-10-CM | POA: Diagnosis not present

## 2023-09-15 DIAGNOSIS — I340 Nonrheumatic mitral (valve) insufficiency: Secondary | ICD-10-CM

## 2023-09-15 NOTE — Progress Notes (Signed)
 Cardiology Office Note   Date:  09/15/2023   ID:  Brandon Welch, DOB [birth year], MRN [id number]  PCP:  Pcp, No  Cardiologist:  Debborah Fairly, MD      History of Present Illness: Brandon Welch is a 53 y.o. male who presents for  Chief Complaint  Patient presents with   Follow-up    3 month follow up    Had 172/min episode of afib sat down and went away.      Past Medical History:  Diagnosis Date   Arthritis    Low testosterone       Past Surgical History:  Procedure Laterality Date   EYE SURGERY Left 1975   INGUINAL HERNIA REPAIR Left 1977   Inguinal Hernia Repair   KNEE ARTHROSCOPY WITH ANTERIOR CRUCIATE LIGAMENT (ACL) REPAIR WITH HAMSTRING GRAFT Right 01/15/2015   Procedure: KNEE ARTHROSCOPY WITH ANTERIOR CRUCIATE LIGAMENT (ACL) REPAIR WITH HAMSTRING AUTOGRAFT;  Surgeon: Rande Bushy, MD;  Location: [facility];  Service: Orthopedics;  Laterality: Right;     Current Outpatient Medications  Medication Sig Dispense Refill   aspirin  EC 81 MG tablet Take 1 tablet (81 mg total) by mouth daily. Swallow whole. 30 tablet 12   fexofenadine  (ALLEGRA) 180 MG tablet Take 180 mg by mouth daily.     loratadine (CLARITIN) 10 MG tablet Take 10 mg by mouth daily.     meloxicam  (MOBIC ) 15 MG tablet Take 1 tablet (15 mg total) by mouth daily. 30 tablet 0   Multiple Vitamins-Minerals (CENTRUM SILVER 50+MEN PO) Take 1 tablet by mouth once.     sotalol  (BETAPACE ) 120 MG tablet TAKE 1 TABLET(120 MG) BY MOUTH DAILY 30 tablet 2   Testosterone  1.62 % GEL APPLY 4 PUMPS TOPICALLY TO THE AFFECTED AREA EVERY MORNING 150 g 2   No current facility-administered medications for this visit.    Allergies:   Aloe and Aloe vera concentrate    Social History:   reports that he has never smoked. He has never used smokeless tobacco. He reports that he does not drink alcohol and does not use drugs.   Family History:  family history includes Hypertension in his father; Lymphoma in his mother;  Mental illness in his father.    ROS:     Review of Systems  Constitutional: Negative.   HENT: Negative.    Eyes: Negative.   Respiratory: Negative.    Gastrointestinal: Negative.   Genitourinary: Negative.   Musculoskeletal: Negative.   Skin: Negative.   Neurological: Negative.   Endo/Heme/Allergies: Negative.   Psychiatric/Behavioral: Negative.    All other systems reviewed and are negative.     All other systems are reviewed and negative.    PHYSICAL EXAM: VS:  BP 102/70   Pulse 72   Ht 6' 2 (1.88 m)   Wt 222 lb 12.8 oz (101.1 kg)   SpO2 96%   BMI 28.61 kg/m  , BMI Body mass index is 28.61 kg/m. Last weight:  Wt Readings from Last 3 Encounters:  09/15/23 222 lb 12.8 oz (101.1 kg)  06/09/23 225 lb (102.1 kg)  05/01/23 221 lb (100.2 kg)     Physical Exam Vitals reviewed.  Constitutional:      Appearance: Normal appearance. He is normal weight.  HENT:     Head: Normocephalic.     Nose: Nose normal.     Mouth/Throat:     Mouth: Mucous membranes are moist.   Eyes:     Pupils: Pupils are equal, round, and  reactive to light.    Cardiovascular:     Rate and Rhythm: Normal rate and regular rhythm.     Pulses: Normal pulses.     Heart sounds: Normal heart sounds.  Pulmonary:     Effort: Pulmonary effort is normal.  Abdominal:     General: Abdomen is flat. Bowel sounds are normal.   Musculoskeletal:        General: Normal range of motion.     Cervical back: Normal range of motion.   Skin:    General: Skin is warm.   Neurological:     General: No focal deficit present.     Mental Status: He is alert.   Psychiatric:        Mood and Affect: Mood normal.       EKG:   Recent Labs: 04/14/2023: ALT 18; BUN 19; Creatinine, Ser 1.11; Hemoglobin 14.7; Platelets 208; Potassium 4.6; Sodium 142; TSH 2.090    Lipid Panel    Component Value Date/Time   CHOL 190 04/14/2023 0853   TRIG 61 04/14/2023 0853   HDL 43 04/14/2023 0853   CHOLHDL 4.4  04/14/2023 0853   LDLCALC 136 (H) 04/14/2023 0853      Other studies Reviewed: Additional studies/ records that were reviewed today include:  Review of the above records demonstrates:       No data to display            ASSESSMENT AND PLAN:    ICD-10-CM   1. Atrial fibrillation, currently in sinus rhythm  Z86.79    had only 1 episode and is not bothered by it.    2. Nonrheumatic mitral valve regurgitation  I34.0     3. Palpitation  R00.2     4. Dizziness  R42        Problem List Items Addressed This Visit   None Visit Diagnoses       Atrial fibrillation, currently in sinus rhythm    -  Primary   had only 1 episode and is not bothered by it.     Nonrheumatic mitral valve regurgitation         Palpitation         Dizziness              Disposition:   Return in about 3 months (around 12/16/2023).    Total time spent: 30 minutes  Signed,  Debborah Fairly, MD  09/15/2023 9:27 AM    [facility]

## 2023-10-02 ENCOUNTER — Other Ambulatory Visit: Payer: Self-pay

## 2023-10-02 DIAGNOSIS — E291 Testicular hypofunction: Secondary | ICD-10-CM

## 2023-10-02 MED ORDER — TESTOSTERONE 1.62 % TD GEL: TRANSDERMAL | 2 refills | Status: DC

## 2023-11-20 ENCOUNTER — Ambulatory Visit: Payer: Self-pay | Admitting: Physician Assistant

## 2023-11-20 ENCOUNTER — Encounter: Payer: Self-pay | Admitting: Physician Assistant

## 2023-11-20 VITALS — BP 126/78 | HR 68 | Resp 14 | Ht 74.0 in | Wt 222.0 lb

## 2023-11-20 DIAGNOSIS — Z024 Encounter for examination for driving license: Secondary | ICD-10-CM

## 2023-11-20 LAB — POCT URINALYSIS DIPSTICK
Bilirubin, UA: NEGATIVE
Blood, UA: NEGATIVE
Glucose, UA: NEGATIVE
Ketones, UA: NEGATIVE
Leukocytes, UA: NEGATIVE
Nitrite, UA: NEGATIVE
Protein, UA: NEGATIVE
Spec Grav, UA: 1.02 (ref 1.010–1.025)
Urobilinogen, UA: 0.2 U/dL
pH, UA: 6 (ref 5.0–8.0)

## 2023-11-20 NOTE — Progress Notes (Signed)
 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 Pt states rt great toe gets painful in the join after walking a lot tender on top as well.

## 2023-11-20 NOTE — Progress Notes (Signed)
   Subjective: DOT certification    Patient ID: Brandon Welch, male    DOB: Sep 19, 1970, 53 y.o.   MRN: [id number]  HPI Patient presents for DOT recertification.  Patient was concern for pain to the left great toe with prolonged walking.   Review of Systems Hypogonadism    Objective:   Physical Exam BP 126/78  Cuff Size Large  Pulse Rate 68  Weight 222 lb (100.7 kg)  Height 6' 2 (1.88 m)  Resp 14  SpO2 98 %   Other Vitals   BMI: 28.50 kg/m2  BSA: 2.29 m2  No acute distress. HEENT is unremarkable. Neck is supple without lymphadenopathy or bruits. Lungs are clear to auscultation. Heart regular rate and rhythm. Abdomen with negative HSM, normoactive bowel sounds, and soft nontender to palpation. No obvious deformity to upper or lower extremities.  Patient has full and equal range of motion of the upper and lower extremities.  Neurovascular intact. No obvious deformity to the cervical or lumbar spine.  Patient has full and equal range of motion of the cervical and lumbar spine. Cranial nerves II through XII are grossly intact.       Assessment & Plan: DOT recertification  Patient meets requirement for 2-year recertification.

## 2023-12-12 ENCOUNTER — Other Ambulatory Visit: Payer: Self-pay | Admitting: Cardiovascular Disease

## 2023-12-22 ENCOUNTER — Ambulatory Visit (INDEPENDENT_AMBULATORY_CARE_PROVIDER_SITE_OTHER): Admitting: Cardiovascular Disease

## 2023-12-22 ENCOUNTER — Encounter: Payer: Self-pay | Admitting: Cardiovascular Disease

## 2023-12-22 VITALS — BP 102/70 | HR 65 | Ht 74.0 in | Wt 224.2 lb

## 2023-12-22 DIAGNOSIS — I471 Supraventricular tachycardia, unspecified: Secondary | ICD-10-CM | POA: Diagnosis not present

## 2023-12-22 DIAGNOSIS — R42 Dizziness and giddiness: Secondary | ICD-10-CM

## 2023-12-22 DIAGNOSIS — I340 Nonrheumatic mitral (valve) insufficiency: Secondary | ICD-10-CM

## 2023-12-22 DIAGNOSIS — R002 Palpitations: Secondary | ICD-10-CM

## 2023-12-22 DIAGNOSIS — Z8679 Personal history of other diseases of the circulatory system: Secondary | ICD-10-CM

## 2023-12-22 MED ORDER — SOTALOL HCL 80 MG PO TABS: 80.0000 mg | ORAL_TABLET | Freq: Two times a day (BID) | ORAL | 11 refills | Status: AC

## 2023-12-22 NOTE — Progress Notes (Signed)
 Cardiology Office Note   Date:  12/22/2023   ID:  Brandon Welch, DOB 08/23/1970, MRN [id number]  PCP:  Pcp, No  Cardiologist:  Denyse Bathe, MD      History of Present Illness: Brandon Welch is a 53 y.o. male who presents for  Chief Complaint  Patient presents with   Follow-up    3 month follow up    Had 1 episode 150/min with tightness and goes away.      Past Medical History:  Diagnosis Date   Arthritis    Low testosterone       Past Surgical History:  Procedure Laterality Date   EYE SURGERY Left 1975   INGUINAL HERNIA REPAIR Left 1977   Inguinal Hernia Repair   KNEE ARTHROSCOPY WITH ANTERIOR CRUCIATE LIGAMENT (ACL) REPAIR WITH HAMSTRING GRAFT Right 01/15/2015   Procedure: KNEE ARTHROSCOPY WITH ANTERIOR CRUCIATE LIGAMENT (ACL) REPAIR WITH HAMSTRING AUTOGRAFT;  Surgeon: Franky Cranker, MD;  Location: [facility] OR[facility];  Service: Orthopedics;  Laterality: Right;     Current Outpatient Medications  Medication Sig Dispense Refill   sotalol  (BETAPACE ) 80 MG tablet Take 1 tablet (80 mg total) by mouth 2 (two) times daily. 60 tablet 11   aspirin  EC 81 MG tablet Take 1 tablet (81 mg total) by mouth daily. Swallow whole. 30 tablet 12   fexofenadine  (ALLEGRA) 180 MG tablet Take 180 mg by mouth daily.     loratadine (CLARITIN) 10 MG tablet Take 10 mg by mouth daily.     meloxicam  (MOBIC ) 15 MG tablet Take 1 tablet (15 mg total) by mouth daily. 30 tablet 0   Multiple Vitamins-Minerals (CENTRUM SILVER 50+MEN PO) Take 1 tablet by mouth once.     Testosterone  1.62 % GEL APPLY 4 PUMPS TOPICALLY TO THE AFFECTED AREA EVERY MORNING 150 g 2   No current facility-administered medications for this visit.    Allergies:   Aloe and Aloe vera concentrate    Social History:   reports that he has never smoked. He has never used smokeless tobacco. He reports that he does not drink alcohol and does not use drugs.   Family History:  family history includes Hypertension in his father;  Lymphoma in his mother; Mental illness in his father.    ROS:     Review of Systems  Constitutional: Negative.   HENT: Negative.    Eyes: Negative.   Respiratory: Negative.    Gastrointestinal: Negative.   Genitourinary: Negative.   Musculoskeletal: Negative.   Skin: Negative.   Neurological: Negative.   Endo/Heme/Allergies: Negative.   Psychiatric/Behavioral: Negative.    All other systems reviewed and are negative.     All other systems are reviewed and negative.    PHYSICAL EXAM: VS:  BP 102/70   Pulse 65   Ht 6' 2 (1.88 m)   Wt 224 lb 3.2 oz (101.7 kg)   SpO2 97%   BMI 28.79 kg/m  , BMI Body mass index is 28.79 kg/m. Last weight:  Wt Readings from Last 3 Encounters:  12/22/23 224 lb 3.2 oz (101.7 kg)  11/20/23 222 lb (100.7 kg)  09/15/23 222 lb 12.8 oz (101.1 kg)     Physical Exam Vitals reviewed.  Constitutional:      Appearance: Normal appearance. He is normal weight.  HENT:     Head: Normocephalic.     Nose: Nose normal.     Mouth/Throat:     Mouth: Mucous membranes are moist.  Eyes:     Pupils:  Pupils are equal, round, and reactive to light.  Cardiovascular:     Rate and Rhythm: Normal rate and regular rhythm.     Pulses: Normal pulses.     Heart sounds: Normal heart sounds.  Pulmonary:     Effort: Pulmonary effort is normal.  Abdominal:     General: Abdomen is flat. Bowel sounds are normal.  Musculoskeletal:        General: Normal range of motion.     Cervical back: Normal range of motion.  Skin:    General: Skin is warm.  Neurological:     General: No focal deficit present.     Mental Status: He is alert.  Psychiatric:        Mood and Affect: Mood normal.       EKG:   Recent Labs: 04/14/2023: ALT 18; BUN 19; Creatinine, Ser 1.11; Hemoglobin 14.7; Platelets 208; Potassium 4.6; Sodium 142; TSH 2.090    Lipid Panel    Component Value Date/Time   CHOL 190 04/14/2023 0853   TRIG 61 04/14/2023 0853   HDL 43 04/14/2023 0853    CHOLHDL 4.4 04/14/2023 0853   LDLCALC 136 (H) 04/14/2023 0853      Other studies Reviewed: Additional studies/ records that were reviewed today include:  Review of the above records demonstrates:       No data to display            ASSESSMENT AND PLAN:    ICD-10-CM   1. SVT (supraventricular tachycardia)  I47.10 sotalol  (BETAPACE ) 80 MG tablet    2. Atrial fibrillation, currently in sinus rhythm  Z86.79 sotalol  (BETAPACE ) 80 MG tablet   Had another episode, afib, thus chang sotolol 120 to 80 bid.    3. Nonrheumatic mitral valve regurgitation  I34.0 sotalol  (BETAPACE ) 80 MG tablet    4. Palpitation  R00.2 sotalol  (BETAPACE ) 80 MG tablet    5. Dizziness  R42 sotalol  (BETAPACE ) 80 MG tablet       Problem List Items Addressed This Visit       Cardiovascular and Mediastinum   SVT (supraventricular tachycardia) - Primary   Relevant Medications   sotalol  (BETAPACE ) 80 MG tablet   Other Visit Diagnoses       Atrial fibrillation, currently in sinus rhythm       Had another episode, afib, thus chang sotolol 120 to 80 bid.   Relevant Medications   sotalol  (BETAPACE ) 80 MG tablet     Nonrheumatic mitral valve regurgitation       Relevant Medications   sotalol  (BETAPACE ) 80 MG tablet     Palpitation       Relevant Medications   sotalol  (BETAPACE ) 80 MG tablet     Dizziness       Relevant Medications   sotalol  (BETAPACE ) 80 MG tablet          Disposition:   Return in about 4 weeks (around 01/19/2024).    Total time spent: 30 minutes  Signed,  Denyse Bathe, MD  12/22/2023 9:21 AM    [facility]

## 2024-01-26 ENCOUNTER — Ambulatory Visit (INDEPENDENT_AMBULATORY_CARE_PROVIDER_SITE_OTHER): Admitting: Cardiovascular Disease

## 2024-01-26 ENCOUNTER — Other Ambulatory Visit: Payer: Self-pay

## 2024-01-26 ENCOUNTER — Encounter: Payer: Self-pay | Admitting: Cardiovascular Disease

## 2024-01-26 VITALS — BP 116/68 | HR 75 | Ht 74.0 in | Wt 226.8 lb

## 2024-01-26 DIAGNOSIS — I340 Nonrheumatic mitral (valve) insufficiency: Secondary | ICD-10-CM

## 2024-01-26 DIAGNOSIS — I471 Supraventricular tachycardia, unspecified: Secondary | ICD-10-CM | POA: Diagnosis not present

## 2024-01-26 DIAGNOSIS — Z0130 Encounter for examination of blood pressure without abnormal findings: Secondary | ICD-10-CM

## 2024-01-26 DIAGNOSIS — R002 Palpitations: Secondary | ICD-10-CM | POA: Diagnosis not present

## 2024-01-26 DIAGNOSIS — E291 Testicular hypofunction: Secondary | ICD-10-CM

## 2024-01-26 DIAGNOSIS — Z8679 Personal history of other diseases of the circulatory system: Secondary | ICD-10-CM | POA: Diagnosis not present

## 2024-01-26 DIAGNOSIS — Z131 Encounter for screening for diabetes mellitus: Secondary | ICD-10-CM

## 2024-01-26 MED ORDER — TESTOSTERONE 1.62 % TD GEL: TRANSDERMAL | 2 refills | Status: AC

## 2024-01-26 NOTE — Progress Notes (Signed)
 Cardiology Office Note   Date:  01/26/2024   ID:  Brandon Welch, DOB [birth year], MRN [id number]  PCP:  Pcp, No  Cardiologist:  Denyse Bathe, MD      History of Present Illness: Brandon Welch is a 53 y.o. male who presents for  Chief Complaint  Patient presents with   Follow-up    4 week follow up    Feeling much , last time palpitation or speed up on aug 1st.      Past Medical History:  Diagnosis Date   Arthritis    Low testosterone       Past Surgical History:  Procedure Laterality Date   EYE SURGERY Left 1975   INGUINAL HERNIA REPAIR Left 1977   Inguinal Hernia Repair   KNEE ARTHROSCOPY WITH ANTERIOR CRUCIATE LIGAMENT (ACL) REPAIR WITH HAMSTRING GRAFT Right 01/15/2015   Procedure: KNEE ARTHROSCOPY WITH ANTERIOR CRUCIATE LIGAMENT (ACL) REPAIR WITH HAMSTRING AUTOGRAFT;  Surgeon: Franky Cranker, MD;  Location: [facility];  Service: Orthopedics;  Laterality: Right;     Current Outpatient Medications  Medication Sig Dispense Refill   aspirin  EC 81 MG tablet Take 1 tablet (81 mg total) by mouth daily. Swallow whole. 30 tablet 12   fexofenadine  (ALLEGRA) 180 MG tablet Take 180 mg by mouth daily.     Multiple Vitamins-Minerals (CENTRUM SILVER 50+MEN PO) Take 1 tablet by mouth once.     sotalol  (BETAPACE ) 80 MG tablet Take 1 tablet (80 mg total) by mouth 2 (two) times daily. 60 tablet 11   Testosterone  1.62 % GEL APPLY 4 PUMPS TOPICALLY TO THE AFFECTED AREA EVERY MORNING 150 g 2   No current facility-administered medications for this visit.    Allergies:   Aloe and Aloe vera concentrate    Social History:   reports that he has never smoked. He has never used smokeless tobacco. He reports that he does not drink alcohol and does not use drugs.   Family History:  family history includes Hypertension in his father; Lymphoma in his mother; Mental illness in his father.    ROS:     Review of Systems  Constitutional: Negative.   HENT: Negative.    Eyes: Negative.    Respiratory: Negative.    Gastrointestinal: Negative.   Genitourinary: Negative.   Musculoskeletal: Negative.   Skin: Negative.   Neurological: Negative.   Endo/Heme/Allergies: Negative.   Psychiatric/Behavioral: Negative.    All other systems reviewed and are negative.     All other systems are reviewed and negative.    PHYSICAL EXAM: VS:  BP 116/68   Pulse 75   Ht 6' 2 (1.88 m)   Wt 226 lb 12.8 oz (102.9 kg)   SpO2 98%   BMI 29.12 kg/m  , BMI Body mass index is 29.12 kg/m. Last weight:  Wt Readings from Last 3 Encounters:  01/26/24 226 lb 12.8 oz (102.9 kg)  12/22/23 224 lb 3.2 oz (101.7 kg)  11/20/23 222 lb (100.7 kg)     Physical Exam Vitals reviewed.  Constitutional:      Appearance: Normal appearance. He is normal weight.  HENT:     Head: Normocephalic.     Nose: Nose normal.     Mouth/Throat:     Mouth: Mucous membranes are moist.  Eyes:     Pupils: Pupils are equal, round, and reactive to light.  Cardiovascular:     Rate and Rhythm: Normal rate and regular rhythm.     Pulses: Normal pulses.  Heart sounds: Normal heart sounds.  Pulmonary:     Effort: Pulmonary effort is normal.  Abdominal:     General: Abdomen is flat. Bowel sounds are normal.  Musculoskeletal:        General: Normal range of motion.     Cervical back: Normal range of motion.  Skin:    General: Skin is warm.  Neurological:     General: No focal deficit present.     Mental Status: He is alert.  Psychiatric:        Mood and Affect: Mood normal.       EKG:   Recent Labs: 04/14/2023: ALT 18; BUN 19; Creatinine, Ser 1.11; Hemoglobin 14.7; Platelets 208; Potassium 4.6; Sodium 142; TSH 2.090    Lipid Panel    Component Value Date/Time   CHOL 190 04/14/2023 0853   TRIG 61 04/14/2023 0853   HDL 43 04/14/2023 0853   CHOLHDL 4.4 04/14/2023 0853   LDLCALC 136 (H) 04/14/2023 0853      Other studies Reviewed: Additional studies/ records that were reviewed today include:   Review of the above records demonstrates:       No data to display            ASSESSMENT AND PLAN:    ICD-10-CM   1. Palpitation  R00.2     2. SVT (supraventricular tachycardia)  I47.10    Wants to continue sotolol for now, instead of ablation.    3. Atrial fibrillation, currently in sinus rhythm  Z86.79     4. Nonrheumatic mitral valve regurgitation  I34.0        Problem List Items Addressed This Visit       Cardiovascular and Mediastinum   SVT (supraventricular tachycardia)   Other Visit Diagnoses       Palpitation    -  Primary     Atrial fibrillation, currently in sinus rhythm         Nonrheumatic mitral valve regurgitation              Disposition:   Return in about 3 months (around 04/27/2024).    Total time spent: 30 minutes  Signed,  Denyse Bathe, MD  01/26/2024 9:42 AM    [facility]

## 2024-03-29 DIAGNOSIS — M545 Low back pain, unspecified: Secondary | ICD-10-CM | POA: Insufficient documentation

## 2024-04-02 ENCOUNTER — Encounter: Payer: Self-pay | Admitting: Physician Assistant

## 2024-04-02 VITALS — BP 106/65 | HR 72 | Resp 14 | Ht 74.0 in | Wt 215.0 lb

## 2024-04-02 DIAGNOSIS — S39012D Strain of muscle, fascia and tendon of lower back, subsequent encounter: Secondary | ICD-10-CM

## 2024-04-02 MED ORDER — ORPHENADRINE CITRATE ER 100 MG PO TB12: 100.0000 mg | ORAL_TABLET | Freq: Two times a day (BID) | ORAL | 0 refills | Status: AC

## 2024-04-02 NOTE — Progress Notes (Signed)
 Pt presents today with lower l-spine back pain. Pt was seen a year go and was prescribed steroids. Recently the lower back pain has gotten worse to the point where he couldn't stand around new years. Pt recently was seen at [facility] and was prescribed steroids and muscle relaxer's and would like a refill if see fit.

## 2024-04-02 NOTE — Progress Notes (Signed)
" ° °  Subjective: Low back pain    Patient ID: Brandon Welch, male    DOB: [birth year], 54 y.o.   MRN: [id number]  HPI Patient complaining 1 week of low back pain.  No provide stated complaint.  Patient stated pain worsened last week he had to go to [facility].  Patient states imagings were done with no acute findings.  Patient was placed on Robaxin and given a Medrol  Dosepak.  Patient states only mild relief.  Patient denies radicular component to pain.  Patient denies bladder or bowel dysfunction.   Review of Systems Hypogonadism    Objective:   Physical Exam BP 106/65  Cuff Size Large  Pulse Rate 72  Weight 215 lb (97.5 kg)  Height 6' 2 (1.88 m)  Resp 14  SpO2 95 %   BMI: 27.60 kg/m2  BSA: 2.26 m2  No obvious deformity to the lumbar spine.  No CVA guarding.  Patient has decreased range of motion in all fields.  Patient has left paraspinal muscle spasm with lateral movements.  Patient had negative straight leg test in the sitting position.       Assessment & Plan: Lumbar strain  Patient advised to finish steroids and will start Norflex .  Patient advised to follow-up in 1 week if no improvement or worsening complaints.  "

## 2024-04-08 ENCOUNTER — Ambulatory Visit: Admitting: Cardiovascular Disease

## 2024-04-11 ENCOUNTER — Encounter: Payer: Self-pay | Admitting: Physician Assistant

## 2024-04-11 ENCOUNTER — Ambulatory Visit: Payer: Self-pay | Admitting: Physician Assistant

## 2024-04-11 VITALS — BP 118/68 | HR 80 | Temp 97.6°F | Resp 14 | Ht 74.0 in | Wt 215.0 lb

## 2024-04-11 DIAGNOSIS — J0100 Acute maxillary sinusitis, unspecified: Secondary | ICD-10-CM

## 2024-04-11 MED ORDER — FEXOFENADINE-PSEUDOEPHED ER 60-120 MG PO TB12: 1.0000 | ORAL_TABLET | Freq: Two times a day (BID) | ORAL | 0 refills | Status: AC

## 2024-04-11 MED ORDER — BENZONATATE 200 MG PO CAPS: 200.0000 mg | ORAL_CAPSULE | Freq: Three times a day (TID) | ORAL | 0 refills | Status: AC | PRN

## 2024-04-11 MED ORDER — AMOXICILLIN-POT CLAVULANATE 875-125 MG PO TABS: 1.0000 | ORAL_TABLET | Freq: Two times a day (BID) | ORAL | 0 refills | Status: AC

## 2024-04-11 NOTE — Progress Notes (Signed)
" ° °  Subjective: Sinus congestion    Patient ID: KEYLEN ECKENRODE, male    DOB: 05-May-1970, 54 y.o.   MRN: [id number]  HPI Patient complains of 1-1/2-week of sinus congestion, facial pain, and ear pressure.  Patient states onset of complaint started 1/2 weeks ago with bodyaches and cough.  Patient states few days later he he developed the above complaints.  Denies fever/chills.  No recent travel or known contact with COVID-19.   Review of Systems Hypogonadism and SVT.    Objective:   Physical Exam BP 118/68  Cuff Size Normal  Pulse Rate 80  Temp 97.6 F (36.4 C)  Weight 215 lb (97.5 kg)  Height 6' 2 (1.88 m)  Resp 14  SpO2 96 %   BMI: 27.60 kg/m2  BSA: 2.26 m2  Appears fatigued. HEENT is remarkable for edematous nasal turbinates and postnasal drainage.  Patient is positive maxillary guarding with palpation. Lungs are clear to auscultation.  Increased cough with deep inspirations. Heart regular rate and rhythm.       Assessment & Plan: Subacute maxillary sinusitis  Patient given a prescription for Augmentin , Allegra-D, and Tessalon  Perles.  Advised to follow-up if no improvement in 3 to 4 days.  "

## 2024-04-11 NOTE — Progress Notes (Signed)
 Pt presents today stating on 04-05-24 he had  body aches lasted one day-now  Sinus drainage, cough and stopped up ears. Pt states he's been taking mucinex and and Nyquil.

## 2024-04-26 ENCOUNTER — Ambulatory Visit: Payer: Self-pay

## 2024-04-26 DIAGNOSIS — Z0000 Encounter for general adult medical examination without abnormal findings: Secondary | ICD-10-CM

## 2024-04-26 DIAGNOSIS — E291 Testicular hypofunction: Secondary | ICD-10-CM

## 2024-04-26 NOTE — Progress Notes (Signed)
 Pt will complete EKG & UA at next visit.

## 2024-04-27 LAB — CMP12+LP+TP+TSH+6AC+PSA+CBC…
ALT: 23 [IU]/L (ref 0–44)
AST: 47 [IU]/L — ABNORMAL HIGH (ref 0–40)
Albumin: 4.8 g/dL (ref 3.8–4.9)
Alkaline Phosphatase: 71 [IU]/L (ref 47–123)
BUN/Creatinine Ratio: 17 (ref 9–20)
BUN: 20 mg/dL (ref 6–24)
Basophils Absolute: 0.1 10*3/uL (ref 0.0–0.2)
Basos: 2 %
Bilirubin Total: 0.8 mg/dL (ref 0.0–1.2)
Calcium: 9.6 mg/dL (ref 8.7–10.2)
Chloride: 106 mmol/L (ref 96–106)
Chol/HDL Ratio: 4.6 ratio (ref 0.0–5.0)
Cholesterol, Total: 201 mg/dL — ABNORMAL HIGH (ref 100–199)
Creatinine, Ser: 1.21 mg/dL (ref 0.76–1.27)
EOS (ABSOLUTE): 0.2 10*3/uL (ref 0.0–0.4)
Eos: 5 %
Estimated CHD Risk: 0.9 times avg. (ref 0.0–1.0)
Free Thyroxine Index: 2.6 (ref 1.2–4.9)
GGT: 10 [IU]/L (ref 0–65)
Globulin, Total: 2.4 g/dL (ref 1.5–4.5)
Glucose: 88 mg/dL (ref 70–99)
HDL: 44 mg/dL
Hematocrit: 42.9 % (ref 37.5–51.0)
Hemoglobin: 14.2 g/dL (ref 13.0–17.7)
Immature Grans (Abs): 0 10*3/uL (ref 0.0–0.1)
Immature Granulocytes: 0 %
Iron: 87 ug/dL (ref 38–169)
LDH: 212 [IU]/L (ref 121–224)
LDL Chol Calc (NIH): 141 mg/dL — ABNORMAL HIGH (ref 0–99)
Lymphocytes Absolute: 1.6 10*3/uL (ref 0.7–3.1)
Lymphs: 35 %
MCH: 28.6 pg (ref 26.6–33.0)
MCHC: 33.1 g/dL (ref 31.5–35.7)
MCV: 86 fL (ref 79–97)
Monocytes Absolute: 0.5 10*3/uL (ref 0.1–0.9)
Monocytes: 12 %
Neutrophils Absolute: 2.1 10*3/uL (ref 1.4–7.0)
Neutrophils: 46 %
Phosphorus: 3.4 mg/dL (ref 2.8–4.1)
Platelets: 235 10*3/uL (ref 150–450)
Potassium: 4.6 mmol/L (ref 3.5–5.2)
Prostate Specific Ag, Serum: 1.6 ng/mL (ref 0.0–4.0)
RBC: 4.97 x10E6/uL (ref 4.14–5.80)
RDW: 12.9 % (ref 11.6–15.4)
Sodium: 145 mmol/L — ABNORMAL HIGH (ref 134–144)
T3 Uptake Ratio: 32 % (ref 24–39)
T4, Total: 8 ug/dL (ref 4.5–12.0)
TSH: 2.18 u[IU]/mL (ref 0.450–4.500)
Total Protein: 7.2 g/dL (ref 6.0–8.5)
Triglycerides: 88 mg/dL (ref 0–149)
Uric Acid: 5.3 mg/dL (ref 3.8–8.4)
VLDL Cholesterol Cal: 16 mg/dL (ref 5–40)
WBC: 4.5 10*3/uL (ref 3.4–10.8)
eGFR: 72 mL/min/{1.73_m2}

## 2024-04-27 LAB — TESTOSTERONE: Testosterone: 688 ng/dL (ref 264–916)

## 2024-04-29 ENCOUNTER — Ambulatory Visit: Admitting: Cardiovascular Disease

## 2024-05-06 ENCOUNTER — Encounter: Admitting: Physician Assistant

## 2024-05-09 ENCOUNTER — Ambulatory Visit: Admitting: Cardiovascular Disease
